# Patient Record
Sex: Male | Born: 1980 | Race: White | Hispanic: Yes | Marital: Married | Smoking: Never smoker
Health system: Southern US, Community
[De-identification: ages and names within clinical notes are randomized; demographics above are authoritative.]

## PROBLEM LIST (undated history)

## (undated) DIAGNOSIS — F1910 Other psychoactive substance abuse, uncomplicated: Secondary | ICD-10-CM

## (undated) DIAGNOSIS — T7840XA Allergy, unspecified, initial encounter: Secondary | ICD-10-CM

## (undated) DIAGNOSIS — F4481 Dissociative identity disorder: Secondary | ICD-10-CM

## (undated) DIAGNOSIS — F4310 Post-traumatic stress disorder, unspecified: Secondary | ICD-10-CM

## (undated) DIAGNOSIS — F1020 Alcohol dependence, uncomplicated: Secondary | ICD-10-CM

## (undated) DIAGNOSIS — E785 Hyperlipidemia, unspecified: Secondary | ICD-10-CM

## (undated) DIAGNOSIS — K219 Gastro-esophageal reflux disease without esophagitis: Secondary | ICD-10-CM

## (undated) HISTORY — DX: Allergy, unspecified, initial encounter: T78.40XA

## (undated) HISTORY — PX: MIDDLE EAR SURGERY: SHX713

## (undated) HISTORY — DX: Post-traumatic stress disorder, unspecified: F43.10

## (undated) HISTORY — DX: Alcohol dependence, uncomplicated: F10.20

## (undated) HISTORY — DX: Hyperlipidemia, unspecified: E78.5

## (undated) HISTORY — DX: Other psychoactive substance abuse, uncomplicated: F19.10

## (undated) HISTORY — DX: Gastro-esophageal reflux disease without esophagitis: K21.9

## (undated) HISTORY — DX: Dissociative identity disorder: F44.81

---
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
## 2009-03-12 ENCOUNTER — Emergency Department (HOSPITAL_COMMUNITY): Admission: EM | Admit: 2009-03-12 | Discharge: 2009-03-13 | Payer: Self-pay | Admitting: Emergency Medicine

## 2009-03-13 ENCOUNTER — Inpatient Hospital Stay (HOSPITAL_COMMUNITY): Admission: AD | Admit: 2009-03-13 | Discharge: 2009-03-18 | Payer: Self-pay | Admitting: Psychiatry

## 2009-03-13 ENCOUNTER — Ambulatory Visit: Payer: Self-pay | Admitting: Psychiatry

## 2009-10-03 ENCOUNTER — Inpatient Hospital Stay (HOSPITAL_COMMUNITY): Admission: EM | Admit: 2009-10-03 | Discharge: 2009-10-08 | Payer: Self-pay | Admitting: Psychiatry

## 2009-10-03 ENCOUNTER — Emergency Department (HOSPITAL_COMMUNITY): Admission: EM | Admit: 2009-10-03 | Discharge: 2009-10-03 | Payer: Self-pay | Admitting: Emergency Medicine

## 2009-10-03 ENCOUNTER — Ambulatory Visit: Payer: Self-pay | Admitting: Psychiatry

## 2010-05-24 LAB — DIFFERENTIAL
Basophils Relative: 1 % (ref 0–1)
Eosinophils Absolute: 0.1 10*3/uL (ref 0.0–0.7)
Monocytes Absolute: 0.9 10*3/uL (ref 0.1–1.0)
Monocytes Relative: 11 % (ref 3–12)
Neutrophils Relative %: 65 % (ref 43–77)

## 2010-05-24 LAB — POCT I-STAT, CHEM 8
Calcium, Ion: 1.08 mmol/L — ABNORMAL LOW (ref 1.12–1.32)
Glucose, Bld: 109 mg/dL — ABNORMAL HIGH (ref 70–99)
HCT: 49 % (ref 39.0–52.0)
Hemoglobin: 16.7 g/dL (ref 13.0–17.0)

## 2010-05-24 LAB — CBC
HCT: 46.9 % (ref 39.0–52.0)
Hemoglobin: 16.3 g/dL (ref 13.0–17.0)
MCH: 30.9 pg (ref 26.0–34.0)
MCHC: 34.7 g/dL (ref 30.0–36.0)
RDW: 13 % (ref 11.5–15.5)

## 2010-05-24 LAB — RAPID URINE DRUG SCREEN, HOSP PERFORMED
Benzodiazepines: POSITIVE — AB
Cocaine: NOT DETECTED
Opiates: NOT DETECTED
Tetrahydrocannabinol: POSITIVE — AB

## 2010-05-25 LAB — CBC
MCHC: 34.5 g/dL (ref 30.0–36.0)
RDW: 12.4 % (ref 11.5–15.5)

## 2010-05-25 LAB — BASIC METABOLIC PANEL
BUN: 9 mg/dL (ref 6–23)
CO2: 26 mEq/L (ref 19–32)
Calcium: 10 mg/dL (ref 8.4–10.5)
Creatinine, Ser: 1.2 mg/dL (ref 0.4–1.5)
Glucose, Bld: 106 mg/dL — ABNORMAL HIGH (ref 70–99)

## 2010-05-25 LAB — DIFFERENTIAL
Basophils Absolute: 0 10*3/uL (ref 0.0–0.1)
Basophils Relative: 0 % (ref 0–1)
Monocytes Absolute: 0.4 10*3/uL (ref 0.1–1.0)
Neutro Abs: 5 10*3/uL (ref 1.7–7.7)
Neutrophils Relative %: 73 % (ref 43–77)

## 2010-05-25 LAB — RAPID URINE DRUG SCREEN, HOSP PERFORMED
Opiates: NOT DETECTED
Tetrahydrocannabinol: POSITIVE — AB

## 2017-03-15 ENCOUNTER — Encounter: Payer: Self-pay | Admitting: Nurse Practitioner

## 2017-03-15 ENCOUNTER — Ambulatory Visit: Payer: 59 | Admitting: Nurse Practitioner

## 2017-03-15 VITALS — BP 122/82 | HR 71 | Temp 98.6°F | Ht 70.0 in | Wt 236.0 lb

## 2017-03-15 DIAGNOSIS — Z8659 Personal history of other mental and behavioral disorders: Secondary | ICD-10-CM | POA: Insufficient documentation

## 2017-03-15 DIAGNOSIS — Z6833 Body mass index (BMI) 33.0-33.9, adult: Secondary | ICD-10-CM | POA: Diagnosis not present

## 2017-03-15 DIAGNOSIS — Z713 Dietary counseling and surveillance: Secondary | ICD-10-CM

## 2017-03-15 DIAGNOSIS — F449 Dissociative and conversion disorder, unspecified: Secondary | ICD-10-CM | POA: Insufficient documentation

## 2017-03-15 DIAGNOSIS — E669 Obesity, unspecified: Secondary | ICD-10-CM

## 2017-03-15 DIAGNOSIS — Z62819 Personal history of unspecified abuse in childhood: Secondary | ICD-10-CM | POA: Insufficient documentation
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
## 2017-03-15 NOTE — Patient Instructions (Addendum)
Please send medical release to get records from [facility].  Please inquire with insurance about date of last CPE. Schedule next CPE with me based on that date.  You can also inquire with insurance about coverage to be seen by [facility]: Dr. Quillian Quincearen Beasley. [phone].  I will like to review previous lab results prior to prescribing any medication. Increase exercise ( 30mins daily)  Thank you for choosing us for your medical needs.   Exercising to Lose Weight Exercising can help you to lose weight. In order to lose weight through exercise, you need to do vigorous-intensity exercise. You can tell that you are exercising with vigorous intensity if you are breathing very hard and fast and cannot hold a conversation while exercising. Moderate-intensity exercise helps to maintain your current weight. You can tell that you are exercising at a moderate level if you have a higher heart rate and faster breathing, but you are still able to hold a conversation. How often should I exercise? Choose an activity that you enjoy and set realistic goals. Your health care provider can help you to make an activity plan that works for you. Exercise regularly as directed by your health care provider. This may include:  Doing resistance training twice each week, such as: ? Push-ups. ? Sit-ups. ? Lifting weights. ? Using resistance bands.  Doing a given intensity of exercise for a given amount of time. Choose from these options: ? 150 minutes of moderate-intensity exercise every week. ? 75 minutes of vigorous-intensity exercise every week. ? A mix of moderate-intensity and vigorous-intensity exercise every week.  Children, pregnant women, people who are out of shape, people who are overweight, and older adults may need to consult a health care provider for individual recommendations. If you have any sort of medical condition, be sure to consult your health care  provider before starting a new exercise program. What are some activities that can help me to lose weight?  Walking at a rate of at least 4.5 miles an hour.  Jogging or running at a rate of 5 miles per hour.  Biking at a rate of at least 10 miles per hour.  Lap swimming.  Roller-skating or in-line skating.  Cross-country skiing.  Vigorous competitive sports, such as football, basketball, and soccer.  Jumping rope.  Aerobic dancing. How can I be more active in my day-to-day activities?  Use the stairs instead of the elevator.  Take a walk during your lunch break.  If you drive, park your car farther away from work or school.  If you take public transportation, get off one stop early and walk the rest of the way.  Make all of your phone calls while standing up and walking around.  Get up, stretch, and walk around every 30 minutes throughout the day. What guidelines should I follow while exercising?  Do not exercise so much that you hurt yourself, feel dizzy, or get very short of breath.  Consult your health care provider prior to starting a new exercise program.  Wear comfortable clothes and shoes with good support.  Drink plenty of water while you exercise to prevent dehydration or heat stroke. Body water is lost during exercise and must be replaced.  Work out until you breathe faster and your heart beats faster. This information is not intended to replace advice given to you by your health care provider. Make sure you discuss any questions you have with your health care provider. Document Released: 03/28/2010  Document Revised: 08/01/2015 Document Reviewed: 07/27/2013 Elsevier Interactive Patient Education  [employer].

## 2017-04-12 ENCOUNTER — Encounter: Payer: Self-pay | Admitting: Nurse Practitioner

## 2017-04-12 ENCOUNTER — Ambulatory Visit: Payer: 59 | Admitting: Nurse Practitioner

## 2017-04-12 VITALS — BP 124/84 | HR 83 | Temp 97.8°F | Ht 70.0 in | Wt 240.0 lb

## 2017-04-12 DIAGNOSIS — E669 Obesity, unspecified: Secondary | ICD-10-CM

## 2017-04-12 DIAGNOSIS — Z713 Dietary counseling and surveillance: Secondary | ICD-10-CM

## 2017-04-12 MED ORDER — LORCASERIN HCL ER 20 MG PO TB24: 1.0000 | ORAL_TABLET | Freq: Every day | ORAL | 0 refills | Status: DC

## 2017-04-12 NOTE — Progress Notes (Signed)
   Subjective:  Patient ID: Shane Munoz, male    DOB: December 16, 1980  Age: 37 y.o. MRN: [id number]  CC: Follow-up (4 wk follow up/weight loss)   HPI Obesity: He will like to discuss weight loss. Weight loss goal 30Lbs (5Lbs per month) Current Exercise: walking 8000steps per day Diet:Decrease portion size and calorie, tracking with app Lost 10Lbs in 2months. Lowest weight 190Ibs in 20s. He will like to use an appetite suppressant despite possible effect on mood and sleep. He has not contacted Dr. Francena HanlyBeasley's office. He is contemplating joining the gym close to home.  No outpatient medications prior to visit.   No facility-administered medications prior to visit.     ROS See HPI  Objective:  BP 124/84   Pulse 83   Temp 97.8 F (36.6 C)   Ht 5\' 10"  (1.778 m)   Wt 240 lb (108.9 kg)   SpO2 95%   BMI 34.44 kg/m   BP Readings from Last 3 Encounters:  04/12/17 124/84  03/15/17 122/82    Wt Readings from Last 3 Encounters:  04/12/17 240 lb (108.9 kg)  03/15/17 236 lb (107 kg)    Physical Exam  Constitutional: He is oriented to person, place, and time. No distress.  Neck: Normal range of motion. Neck supple. No thyromegaly present.  Cardiovascular: Normal rate, regular rhythm and normal heart sounds.  Pulmonary/Chest: Effort normal and breath sounds normal.  Musculoskeletal: He exhibits no edema.  Neurological: He is alert and oriented to person, place, and time.  Skin: Skin is warm and dry.  Vitals reviewed.   Lab Results  Component Value Date   WBC 8.3 10/03/2009   HGB 16.7 10/03/2009   HCT 49.0 10/03/2009   PLT 222 10/03/2009   GLUCOSE 109 (H) 10/03/2009   NA 137 10/03/2009   K 3.6 10/03/2009   CL 105 10/03/2009   CREATININE 1.4 10/03/2009   BUN 21 10/03/2009   CO2 26 03/12/2009    Assessment & Plan:   Gardiner Barefootathaniel was seen today for follow-up.  Diagnoses and all orders for this visit:  Weight loss counseling, encounter for -     Lorcaserin HCl ER 20  MG TB24; Take 1 tablet by mouth daily.  Obesity (BMI 30.0-34.9) -     Lorcaserin HCl ER 20 MG TB24; Take 1 tablet by mouth daily. -     TSH; Future -     Comprehensive metabolic panel; Future -     VITAMIN D 25 Hydroxy (Vit-D Deficiency, Fractures); Future   I am having Shane Gensathaniel Smither start on Lorcaserin HCl ER.  Meds ordered this encounter  Medications  . Lorcaserin HCl ER 20 MG TB24    Sig: Take 1 tablet by mouth daily.    Dispense:  30 tablet    Refill:  0    Order Specific Question:   Supervising Provider    Answer:   Dianne DunARON, TALIA M [[id number]]   Follow-up: Return in about 4 weeks (around 05/10/2017) for weight loss.  Alysia Pennaharlotte Emmersyn Kratzke, NP

## 2017-04-12 NOTE — Patient Instructions (Addendum)
Go to lab for blood draw.  Calorie Counting for Weight Loss Calories are units of energy. Your body needs a certain amount of calories from food to keep you going throughout the day. When you eat more calories than your body needs, your body stores the extra calories as fat. When you eat fewer calories than your body needs, your body burns fat to get the energy it needs. Calorie counting means keeping track of how many calories you eat and drink each day. Calorie counting can be helpful if you need to lose weight. If you make sure to eat fewer calories than your body needs, you should lose weight. Ask your health care provider what a healthy weight is for you. For calorie counting to work, you will need to eat the right number of calories in a day in order to lose a healthy amount of weight per week. A dietitian can help you determine how many calories you need in a day and will give you suggestions on how to reach your calorie goal.  A healthy amount of weight to lose per week is usually 1-2 lb (0.5-0.9 kg). This usually means that your daily calorie intake should be reduced by 500-750 calories.  Eating 1,200 - 1,500 calories per day can help most women lose weight.  Eating 1,500 - 1,800 calories per day can help most men lose weight.  What is my plan? My goal is to have _1800_____ calories per day. If I have this many calories per day, I should lose around _____1_____ pounds per week. What do I need to know about calorie counting? In order to meet your daily calorie goal, you will need to:  Find out how many calories are in each food you would like to eat. Try to do this before you eat.  Decide how much of the food you plan to eat.  Write down what you ate and how many calories it had. Doing this is called keeping a food log.  To successfully lose weight, it is important to balance calorie counting with a healthy lifestyle that includes regular activity. Aim for 150 minutes of moderate  exercise (such as walking) or 75 minutes of vigorous exercise (such as running) each week. Where do I find calorie information?  The number of calories in a food can be found on a Nutrition Facts label. If a food does not have a Nutrition Facts label, try to look up the calories online or ask your dietitian for help. Remember that calories are listed per serving. If you choose to have more than one serving of a food, you will have to multiply the calories per serving by the amount of servings you plan to eat. For example, the label on a package of bread might say that a serving size is 1 slice and that there are 90 calories in a serving. If you eat 1 slice, you will have eaten 90 calories. If you eat 2 slices, you will have eaten 180 calories. How do I keep a food log? Immediately after each meal, record the following information in your food log:  What you ate. Don't forget to include toppings, sauces, and other extras on the food.  How much you ate. This can be measured in cups, ounces, or number of items.  How many calories each food and drink had.  The total number of calories in the meal.  Keep your food log near you, such as in a small notebook in your pocket, or  use a mobile app or website. Some programs will calculate calories for you and show you how many calories you have left for the day to meet your goal. What are some calorie counting tips?  Use your calories on foods and drinks that will fill you up and not leave you hungry: ? Some examples of foods that fill you up are nuts and nut butters, vegetables, lean proteins, and high-fiber foods like whole grains. High-fiber foods are foods with more than 5 g fiber per serving. ? Drinks such as sodas, specialty coffee drinks, alcohol, and juices have a lot of calories, yet do not fill you up.  Eat nutritious foods and avoid empty calories. Empty calories are calories you get from foods or beverages that do not have many vitamins or  protein, such as candy, sweets, and soda. It is better to have a nutritious high-calorie food (such as an avocado) than a food with few nutrients (such as a bag of chips).  Know how many calories are in the foods you eat most often. This will help you calculate calorie counts faster.  Pay attention to calories in drinks. Low-calorie drinks include water and unsweetened drinks.  Pay attention to nutrition labels for "low fat" or "fat free" foods. These foods sometimes have the same amount of calories or more calories than the full fat versions. They also often have added sugar, starch, or salt, to make up for flavor that was removed with the fat.  Find a way of tracking calories that works for you. Get creative. Try different apps or programs if writing down calories does not work for you. What are some portion control tips?  Know how many calories are in a serving. This will help you know how many servings of a certain food you can have.  Use a measuring cup to measure serving sizes. You could also try weighing out portions on a kitchen scale. With time, you will be able to estimate serving sizes for some foods.  Take some time to put servings of different foods on your favorite plates, bowls, and cups so you know what a serving looks like.  Try not to eat straight from a bag or box. Doing this can lead to overeating. Put the amount you would like to eat in a cup or on a plate to make sure you are eating the right portion.  Use smaller plates, glasses, and bowls to prevent overeating.  Try not to multitask (for example, watch TV or use your computer) while eating. If it is time to eat, sit down at a table and enjoy your food. This will help you to know when you are full. It will also help you to be aware of what you are eating and how much you are eating. What are tips for following this plan? Reading food labels  Check the calorie count compared to the serving size. The serving size may be  smaller than what you are used to eating.  Check the source of the calories. Make sure the food you are eating is high in vitamins and protein and low in saturated and trans fats. Shopping  Read nutrition labels while you shop. This will help you make healthy decisions before you decide to purchase your food.  Make a grocery list and stick to it. Cooking  Try to cook your favorite foods in a healthier way. For example, try baking instead of frying.  Use low-fat dairy products. Meal planning  Use more fruits  and vegetables. Half of your plate should be fruits and vegetables.  Include lean proteins like poultry and fish. How do I count calories when eating out?  Ask for smaller portion sizes.  Consider sharing an entree and sides instead of getting your own entree.  If you get your own entree, eat only half. Ask for a box at the beginning of your meal and put the rest of your entree in it so you are not tempted to eat it.  If calories are listed on the menu, choose the lower calorie options.  Choose dishes that include vegetables, fruits, whole grains, low-fat dairy products, and lean protein.  Choose items that are boiled, broiled, grilled, or steamed. Stay away from items that are buttered, battered, fried, or served with cream sauce. Items labeled "crispy" are usually fried, unless stated otherwise.  Choose water, low-fat milk, unsweetened iced tea, or other drinks without added sugar. If you want an alcoholic beverage, choose a lower calorie option such as a glass of wine or light beer.  Ask for dressings, sauces, and syrups on the side. These are usually high in calories, so you should limit the amount you eat.  If you want a salad, choose a garden salad and ask for grilled meats. Avoid extra toppings like bacon, cheese, or fried items. Ask for the dressing on the side, or ask for olive oil and vinegar or lemon to use as dressing.  Estimate how many servings of a food you are  given. For example, a serving of cooked rice is  cup or about the size of half a baseball. Knowing serving sizes will help you be aware of how much food you are eating at restaurants. The list below tells you how big or small some common portion sizes are based on everyday objects: ? 1 oz-4 stacked dice. ? 3 oz-1 deck of cards. ? 1 tsp-1 die. ? 1 Tbsp- a ping-pong ball. ? 2 Tbsp-1 ping-pong ball. ?  cup- baseball. ? 1 cup-1 baseball. Summary  Calorie counting means keeping track of how many calories you eat and drink each day. If you eat fewer calories than your body needs, you should lose weight.  A healthy amount of weight to lose per week is usually 1-2 lb (0.5-0.9 kg). This usually means reducing your daily calorie intake by 500-750 calories.  The number of calories in a food can be found on a Nutrition Facts label. If a food does not have a Nutrition Facts label, try to look up the calories online or ask your dietitian for help.  Use your calories on foods and drinks that will fill you up, and not on foods and drinks that will leave you hungry.  Use smaller plates, glasses, and bowls to prevent overeating. This information is not intended to replace advice given to you by your health care provider. Make sure you discuss any questions you have with your health care provider. Document Released: 02/23/2005 Document Revised: 01/24/2016 Document Reviewed: 01/24/2016 Elsevier Interactive Patient Education  [employer]2018 Elsevier Inc.

## 2017-04-13 ENCOUNTER — Other Ambulatory Visit (INDEPENDENT_AMBULATORY_CARE_PROVIDER_SITE_OTHER): Payer: 59

## 2017-04-13 DIAGNOSIS — E669 Obesity, unspecified: Secondary | ICD-10-CM | POA: Diagnosis not present

## 2017-04-13 DIAGNOSIS — E559 Vitamin D deficiency, unspecified: Secondary | ICD-10-CM

## 2017-04-13 LAB — COMPREHENSIVE METABOLIC PANEL
ALBUMIN: 4.1 g/dL (ref 3.5–5.2)
ALT: 22 U/L (ref 0–53)
AST: 16 U/L (ref 0–37)
Alkaline Phosphatase: 63 U/L (ref 39–117)
BUN: 11 mg/dL (ref 6–23)
CO2: 27 mEq/L (ref 19–32)
CREATININE: 0.97 mg/dL (ref 0.40–1.50)
Calcium: 9 mg/dL (ref 8.4–10.5)
Chloride: 102 mEq/L (ref 96–112)
GFR: 91.81 mL/min (ref >60.00)
GLUCOSE: 103 mg/dL — AB (ref 70–99)
Potassium: 3.3 mEq/L — ABNORMAL LOW (ref 3.5–5.1)
SODIUM: 139 meq/L (ref 135–145)
TOTAL PROTEIN: 6.7 g/dL (ref 6.0–8.3)
Total Bilirubin: 0.6 mg/dL (ref 0.2–1.2)

## 2017-04-13 LAB — TSH: TSH: 2.1 u[IU]/mL (ref 0.35–4.50)

## 2017-04-13 LAB — VITAMIN D 25 HYDROXY (VIT D DEFICIENCY, FRACTURES): VITD: 11.87 ng/mL — ABNORMAL LOW (ref 30.00–100.00)

## 2017-04-14 ENCOUNTER — Telehealth: Payer: Self-pay | Admitting: Nurse Practitioner

## 2017-04-14 MED ORDER — VITAMIN D-3 125 MCG (5000 UT) PO TABS: 1.0000 | ORAL_TABLET | Freq: Every day | ORAL | 0 refills | Status: DC

## 2017-04-14 NOTE — Telephone Encounter (Signed)
Copied from CRM [id number]. Topic: Quick Communication - See Telephone Encounter >> Apr 14, 2017  3:54 PM Rudi CocoLathan, Mackay Hanauer M, NT wrote: CRM for notification. See Telephone encounter for:   04/14/17. Pt. Calling said someone called but no crm or note pt. Does not have vm set up. Pt can be reached at [phone]

## 2017-04-14 NOTE — Telephone Encounter (Signed)
Spoke with the pt. He is aware of his test result.

## 2017-05-10 ENCOUNTER — Ambulatory Visit: Payer: 59 | Admitting: Nurse Practitioner

## 2017-05-10 ENCOUNTER — Encounter: Payer: Self-pay | Admitting: Nurse Practitioner

## 2017-05-10 DIAGNOSIS — Z713 Dietary counseling and surveillance: Secondary | ICD-10-CM | POA: Diagnosis not present

## 2017-05-10 DIAGNOSIS — E669 Obesity, unspecified: Secondary | ICD-10-CM

## 2017-05-10 MED ORDER — LORCASERIN HCL ER 20 MG PO TB24: 1.0000 | ORAL_TABLET | Freq: Every day | ORAL | 1 refills | Status: DC

## 2017-05-10 NOTE — Patient Instructions (Signed)
Exercising to Lose Weight Exercising can help you to lose weight. In order to lose weight through exercise, you need to do vigorous-intensity exercise. You can tell that you are exercising with vigorous intensity if you are breathing very hard and fast and cannot hold a conversation while exercising. Moderate-intensity exercise helps to maintain your current weight. You can tell that you are exercising at a moderate level if you have a higher heart rate and faster breathing, but you are still able to hold a conversation. How often should I exercise? Choose an activity that you enjoy and set realistic goals. Your health care provider can help you to make an activity plan that works for you. Exercise regularly as directed by your health care provider. This may include:  Doing resistance training twice each week, such as: ? Push-ups. ? Sit-ups. ? Lifting weights. ? Using resistance bands.  Doing a given intensity of exercise for a given amount of time. Choose from these options: ? 150 minutes of moderate-intensity exercise every week. ? 75 minutes of vigorous-intensity exercise every week. ? A mix of moderate-intensity and vigorous-intensity exercise every week.  Children, pregnant women, people who are out of shape, people who are overweight, and older adults may need to consult a health care provider for individual recommendations. If you have any sort of medical condition, be sure to consult your health care provider before starting a new exercise program. What are some activities that can help me to lose weight?  Walking at a rate of at least 4.5 miles an hour.  Jogging or running at a rate of 5 miles per hour.  Biking at a rate of at least 10 miles per hour.  Lap swimming.  Roller-skating or in-line skating.  Cross-country skiing.  Vigorous competitive sports, such as football, basketball, and soccer.  Jumping rope.  Aerobic dancing. How can I be more active in my day-to-day  activities?  Use the stairs instead of the elevator.  Take a walk during your lunch break.  If you drive, park your car farther away from work or school.  If you take public transportation, get off one stop early and walk the rest of the way.  Make all of your phone calls while standing up and walking around.  Get up, stretch, and walk around every 30 minutes throughout the day. What guidelines should I follow while exercising?  Do not exercise so much that you hurt yourself, feel dizzy, or get very short of breath.  Consult your health care provider prior to starting a new exercise program.  Wear comfortable clothes and shoes with good support.  Drink plenty of water while you exercise to prevent dehydration or heat stroke. Body water is lost during exercise and must be replaced.  Work out until you breathe faster and your heart beats faster. This information is not intended to replace advice given to you by your health care provider. Make sure you discuss any questions you have with your health care provider. Document Released: 03/28/2010 Document Revised: 08/01/2015 Document Reviewed: 07/27/2013 Elsevier Interactive Patient Education  2018 Elsevier Inc.  

## 2017-05-10 NOTE — Progress Notes (Signed)
Subjective:  Patient ID: Shane Gensathaniel Kraai, male    DOB: [birth year]  Age: 37 y.o. MRN: [id number]  CC: Follow-up (4 wk follow up weight loss. tdap?)  HPI  Weight Loss Counsel: BMI: 34 Lost 3Lbs in last 30days with use of belviq and heart healthy diet. Reports decrease if appetite with belviq Still having difficulty incorporating exercise due to current work schedule. Denies any adverse effects with medication. Denies any change in mood. Wt Readings from Last 3 Encounters:  05/10/17 237 lb (107.5 kg)  04/12/17 240 lb (108.9 kg)  03/15/17 236 lb (107 kg)   Outpatient Medications Prior to Visit  Medication Sig Dispense Refill  . Cholecalciferol (VITAMIN D-3) 5000 units TABS Take 1 tablet by mouth daily after breakfast. 90 tablet 0  . Lorcaserin HCl ER 20 MG TB24 Take 1 tablet by mouth daily. 30 tablet 0   No facility-administered medications prior to visit.     ROS Review of Systems  Constitutional: Negative for malaise/fatigue.  Respiratory: Negative for shortness of breath.   Cardiovascular: Negative for chest pain, palpitations, orthopnea and leg swelling.  Gastrointestinal: Negative for abdominal pain, constipation, diarrhea, nausea and vomiting.  Genitourinary: Negative.   Musculoskeletal: Negative.   Skin: Negative.   Psychiatric/Behavioral: Negative for depression and suicidal ideas. The patient is not nervous/anxious and does not have insomnia.      Objective:  BP 116/78   Pulse 77   Temp 98.3 F (36.8 C)   Ht 5\' 10"  (1.778 m)   Wt 237 lb (107.5 kg)   SpO2 98%   BMI 34.01 kg/m   BP Readings from Last 3 Encounters:  05/10/17 116/78  04/12/17 124/84  03/15/17 122/82    Wt Readings from Last 3 Encounters:  05/10/17 237 lb (107.5 kg)  04/12/17 240 lb (108.9 kg)  03/15/17 236 lb (107 kg)    Physical Exam  Constitutional: He is oriented to person, place, and time. No distress.  Neck: No thyromegaly present.  Cardiovascular: Normal rate, regular rhythm  and normal heart sounds.  Pulmonary/Chest: Effort normal and breath sounds normal.  Musculoskeletal: He exhibits no edema.  Neurological: He is alert and oriented to person, place, and time.  Psychiatric: He has a normal mood and affect. His behavior is normal. Thought content normal.  Vitals reviewed.   Lab Results  Component Value Date   WBC 8.3 10/03/2009   HGB 16.7 10/03/2009   HCT 49.0 10/03/2009   PLT 222 10/03/2009   GLUCOSE 103 (H) 04/13/2017   ALT 22 04/13/2017   AST 16 04/13/2017   NA 139 04/13/2017   K 3.3 (L) 04/13/2017   CL 102 04/13/2017   CREATININE 0.97 04/13/2017   BUN 11 04/13/2017   CO2 27 04/13/2017   TSH 2.10 04/13/2017    Assessment & Plan:   Gardiner Barefootathaniel was seen today for follow-up.  Diagnoses and all orders for this visit:  Obesity (BMI 30.0-34.9) -     Lorcaserin HCl ER 20 MG TB24; Take 1 tablet by mouth daily.  Weight loss counseling, encounter for -     Lorcaserin HCl ER 20 MG TB24; Take 1 tablet by mouth daily.   I am having Shane Munoz maintain his Vitamin D-3 and Lorcaserin HCl ER.  Meds ordered this encounter  Medications  . Lorcaserin HCl ER 20 MG TB24    Sig: Take 1 tablet by mouth daily.    Dispense:  30 tablet    Refill:  1    Order Specific Question:  Supervising Provider    Answer:   Dianne Dun [[id number]]    Follow-up: Return in about 2 months (around 07/10/2017) for weight loss (will need BMP and hepatic function).  Alysia Penna, NP

## 2017-07-12 ENCOUNTER — Ambulatory Visit: Payer: 59 | Admitting: Nurse Practitioner

## 2017-07-12 ENCOUNTER — Encounter: Payer: Self-pay | Admitting: Nurse Practitioner

## 2017-07-12 VITALS — BP 108/70 | HR 70 | Temp 98.6°F | Ht 70.0 in | Wt 243.0 lb

## 2017-07-12 DIAGNOSIS — E669 Obesity, unspecified: Secondary | ICD-10-CM | POA: Diagnosis not present

## 2017-07-12 DIAGNOSIS — E559 Vitamin D deficiency, unspecified: Secondary | ICD-10-CM

## 2017-07-12 DIAGNOSIS — R739 Hyperglycemia, unspecified: Secondary | ICD-10-CM

## 2017-07-12 DIAGNOSIS — Z23 Encounter for immunization: Secondary | ICD-10-CM | POA: Diagnosis not present

## 2017-07-12 DIAGNOSIS — Z114 Encounter for screening for human immunodeficiency virus [HIV]: Secondary | ICD-10-CM | POA: Diagnosis not present

## 2017-07-12 DIAGNOSIS — Z713 Dietary counseling and surveillance: Secondary | ICD-10-CM

## 2017-07-12 LAB — VITAMIN D 25 HYDROXY (VIT D DEFICIENCY, FRACTURES): VITD: 18.12 ng/mL — AB (ref 30.00–100.00)

## 2017-07-12 LAB — BASIC METABOLIC PANEL
BUN: 13 mg/dL (ref 6–23)
CALCIUM: 8.9 mg/dL (ref 8.4–10.5)
CHLORIDE: 104 meq/L (ref 96–112)
CO2: 27 meq/L (ref 19–32)
Creatinine, Ser: 0.99 mg/dL (ref 0.40–1.50)
GFR: 89.55 mL/min (ref >60.00)
Glucose, Bld: 99 mg/dL (ref 70–99)
Potassium: 3.8 mEq/L (ref 3.5–5.1)
SODIUM: 140 meq/L (ref 135–145)

## 2017-07-12 LAB — HEMOGLOBIN A1C: HEMOGLOBIN A1C: 5.5 % (ref 4.6–6.5)

## 2017-07-12 NOTE — Progress Notes (Addendum)
Subjective:  Patient ID: Shane Munoz, male    DOB: 09/17/1980  Age: 37 y.o. MRN: [id number]  CC: Follow-up (2 mo fu/didnt fill Lorcaserine--time issue---more active in 2 wks. tdap)   HPI  Obesity: Has not taken belviq in 63month due to work schedule. Denies nay adverse effects when taking medication. Not consistent with exercise regimen due to work schedule. Reports maintaining 2000Kcal diet at this time. Wt Readings from Last 3 Encounters:  07/12/17 243 lb (110.2 kg)  05/10/17 237 lb (107.5 kg)  04/12/17 240 lb (108.9 kg)   Outpatient Medications Prior to Visit  Medication Sig Dispense Refill  . Cholecalciferol (VITAMIN D-3) 5000 units TABS Take 1 tablet by mouth daily after breakfast. 90 tablet 0  . Lorcaserin HCl ER 20 MG TB24 Take 1 tablet by mouth daily. (Patient not taking: Reported on 07/12/2017) 30 tablet 1   No facility-administered medications prior to visit.     ROS See HPI  Objective:  BP 108/70   Pulse 70   Temp 98.6 F (37 C) (Oral)   Ht  (1.778 m)   Wt 243 lb (110.2 kg)   SpO2 95%   BMI 34.87 kg/m   BP Readings from Last 3 Encounters:  07/12/17 108/70  05/10/17 116/78  04/12/17 124/84    Wt Readings from Last 3 Encounters:  07/12/17 243 lb (110.2 kg)  05/10/17 237 lb (107.5 kg)  04/12/17 240 lb (108.9 kg)   Physical Exam  Constitutional: He is oriented to person, place, and time. No distress.  Neck: Normal range of motion. Neck supple.  Cardiovascular: Normal rate and regular rhythm.  Pulmonary/Chest: Effort normal and breath sounds normal.  Abdominal: Soft. Bowel sounds are normal. He exhibits no distension. There is no tenderness.  Neurological: He is alert and oriented to person, place, and time.  Psychiatric: He has a normal mood and affect. His behavior is normal. Thought content normal.  Vitals reviewed.  Lab Results  Component Value Date   WBC 8.3 10/03/2009   HGB 16.7 10/03/2009   HCT 49.0 10/03/2009   PLT 222 10/03/2009     GLUCOSE 99 07/12/2017   ALT 22 04/13/2017   AST 16 04/13/2017   NA 140 07/12/2017   K 3.8 07/12/2017   CL 104 07/12/2017   CREATININE 0.99 07/12/2017   BUN 13 07/12/2017   CO2 27 07/12/2017   TSH 2.10 04/13/2017   HGBA1C 5.5 07/12/2017    Assessment & Plan:   Shane Munoz was seen today for follow-up.  Diagnoses and all orders for this visit:  Obesity (BMI 30.0-34.9) -     Basic metabolic panel  Need for diphtheria-tetanus-pertussis (Tdap) vaccine -     Tdap vaccine greater than or equal to 7yo IM  Weight loss counseling, encounter for -     Basic metabolic panel  Encounter for screening for human immunodeficiency virus (HIV) -     HIV antibody  Hyperglycemia -     Hemoglobin A1c  Vitamin D deficiency -     VITAMIN D 25 Hydroxy (Vit-D Deficiency, Fractures) -     Vitamin D, Ergocalciferol, (DRISDOL) 50000 units CAPS capsule; Take 1 capsule (50,000 Units total) by mouth every 7 (seven) days.   I have discontinued Shane Munoz's Vitamin D-3. I am also having him start on Vitamin D (Ergocalciferol). Additionally, I am having him maintain his Lorcaserin HCl ER.  Meds ordered this encounter  Medications  . Vitamin D, Ergocalciferol, (DRISDOL) 50000 units CAPS capsule  Sig: Take 1 capsule (50,000 Units total) by mouth every 7 (seven) days.    Dispense:  6 capsule    Refill:  0    Order Specific Question:   Supervising Provider    Answer:   Dianne Dun [[id number]]    Follow-up: Return in about 2 months (around 09/11/2017) for weight loss.Alysia Penna, NP

## 2017-07-12 NOTE — Patient Instructions (Addendum)
Consider use of Wellbutrin and metformin if no weight loss with belviq. Also consider weight loss clinic referreal  Calorie Counting for Weight Loss Calories are units of energy. Your body needs a certain amount of calories from food to keep you going throughout the day. When you eat more calories than your body needs, your body stores the extra calories as fat. When you eat fewer calories than your body needs, your body burns fat to get the energy it needs. Calorie counting means keeping track of how many calories you eat and drink each day. Calorie counting can be helpful if you need to lose weight. If you make sure to eat fewer calories than your body needs, you should lose weight. Ask your health care provider what a healthy weight is for you. For calorie counting to work, you will need to eat the right number of calories in a day in order to lose a healthy amount of weight per week. A dietitian can help you determine how many calories you need in a day and will give you suggestions on how to reach your calorie goal.  A healthy amount of weight to lose per week is usually 1-2 lb (0.5-0.9 kg). This usually means that your daily calorie intake should be reduced by 500-750 calories.  Eating 1,200 - 1,500 calories per day can help most women lose weight.  Eating 1,500 - 1,800 calories per day can help most men lose weight.  What is my plan? My goal is to have __1800________ calories per day. If I have this many calories per day, I should lose around _____1____ pounds per week. What do I need to know about calorie counting? In order to meet your daily calorie goal, you will need to:  Find out how many calories are in each food you would like to eat. Try to do this before you eat.  Decide how much of the food you plan to eat.  Write down what you ate and how many calories it had. Doing this is called keeping a food log.  To successfully lose weight, it is important to balance calorie counting  with a healthy lifestyle that includes regular activity. Aim for 150 minutes of moderate exercise (such as walking) or 75 minutes of vigorous exercise (such as running) each week. Where do I find calorie information?  The number of calories in a food can be found on a Nutrition Facts label. If a food does not have a Nutrition Facts label, try to look up the calories online or ask your dietitian for help. Remember that calories are listed per serving. If you choose to have more than one serving of a food, you will have to multiply the calories per serving by the amount of servings you plan to eat. For example, the label on a package of bread might say that a serving size is 1 slice and that there are 90 calories in a serving. If you eat 1 slice, you will have eaten 90 calories. If you eat 2 slices, you will have eaten 180 calories. How do I keep a food log? Immediately after each meal, record the following information in your food log:  What you ate. Don't forget to include toppings, sauces, and other extras on the food.  How much you ate. This can be measured in cups, ounces, or number of items.  How many calories each food and drink had.  The total number of calories in the meal.  Keep your food log  near you, such as in a small notebook in your pocket, or use a mobile app or website. Some programs will calculate calories for you and show you how many calories you have left for the day to meet your goal. What are some calorie counting tips?  Use your calories on foods and drinks that will fill you up and not leave you hungry: ? Some examples of foods that fill you up are nuts and nut butters, vegetables, lean proteins, and high-fiber foods like whole grains. High-fiber foods are foods with more than 5 g fiber per serving. ? Drinks such as sodas, specialty coffee drinks, alcohol, and juices have a lot of calories, yet do not fill you up.  Eat nutritious foods and avoid empty calories. Empty  calories are calories you get from foods or beverages that do not have many vitamins or protein, such as candy, sweets, and soda. It is better to have a nutritious high-calorie food (such as an avocado) than a food with few nutrients (such as a bag of chips).  Know how many calories are in the foods you eat most often. This will help you calculate calorie counts faster.  Pay attention to calories in drinks. Low-calorie drinks include water and unsweetened drinks.  Pay attention to nutrition labels for "low fat" or "fat free" foods. These foods sometimes have the same amount of calories or more calories than the full fat versions. They also often have added sugar, starch, or salt, to make up for flavor that was removed with the fat.  Find a way of tracking calories that works for you. Get creative. Try different apps or programs if writing down calories does not work for you. What are some portion control tips?  Know how many calories are in a serving. This will help you know how many servings of a certain food you can have.  Use a measuring cup to measure serving sizes. You could also try weighing out portions on a kitchen scale. With time, you will be able to estimate serving sizes for some foods.  Take some time to put servings of different foods on your favorite plates, bowls, and cups so you know what a serving looks like.  Try not to eat straight from a bag or box. Doing this can lead to overeating. Put the amount you would like to eat in a cup or on a plate to make sure you are eating the right portion.  Use smaller plates, glasses, and bowls to prevent overeating.  Try not to multitask (for example, watch TV or use your computer) while eating. If it is time to eat, sit down at a table and enjoy your food. This will help you to know when you are full. It will also help you to be aware of what you are eating and how much you are eating. What are tips for following this plan? Reading food  labels  Check the calorie count compared to the serving size. The serving size may be smaller than what you are used to eating.  Check the source of the calories. Make sure the food you are eating is high in vitamins and protein and low in saturated and trans fats. Shopping  Read nutrition labels while you shop. This will help you make healthy decisions before you decide to purchase your food.  Make a grocery list and stick to it. Cooking  Try to cook your favorite foods in a healthier way. For example, try baking instead of  frying.  Use low-fat dairy products. Meal planning  Use more fruits and vegetables. Half of your plate should be fruits and vegetables.  Include lean proteins like poultry and fish. How do I count calories when eating out?  Ask for smaller portion sizes.  Consider sharing an entree and sides instead of getting your own entree.  If you get your own entree, eat only half. Ask for a box at the beginning of your meal and put the rest of your entree in it so you are not tempted to eat it.  If calories are listed on the menu, choose the lower calorie options.  Choose dishes that include vegetables, fruits, whole grains, low-fat dairy products, and lean protein.  Choose items that are boiled, broiled, grilled, or steamed. Stay away from items that are buttered, battered, fried, or served with cream sauce. Items labeled "crispy" are usually fried, unless stated otherwise.  Choose water, low-fat milk, unsweetened iced tea, or other drinks without added sugar. If you want an alcoholic beverage, choose a lower calorie option such as a glass of wine or light beer.  Ask for dressings, sauces, and syrups on the side. These are usually high in calories, so you should limit the amount you eat.  If you want a salad, choose a garden salad and ask for grilled meats. Avoid extra toppings like bacon, cheese, or fried items. Ask for the dressing on the side, or ask for olive oil  and vinegar or lemon to use as dressing.  Estimate how many servings of a food you are given. For example, a serving of cooked rice is  cup or about the size of half a baseball. Knowing serving sizes will help you be aware of how much food you are eating at restaurants. The list below tells you how big or small some common portion sizes are based on everyday objects: ? 1 oz-4 stacked dice. ? 3 oz-1 deck of cards. ? 1 tsp-1 die. ? 1 Tbsp- a ping-pong ball. ? 2 Tbsp-1 ping-pong ball. ?  cup- baseball. ? 1 cup-1 baseball. Summary  Calorie counting means keeping track of how many calories you eat and drink each day. If you eat fewer calories than your body needs, you should lose weight.  A healthy amount of weight to lose per week is usually 1-2 lb (0.5-0.9 kg). This usually means reducing your daily calorie intake by 500-750 calories.  The number of calories in a food can be found on a Nutrition Facts label. If a food does not have a Nutrition Facts label, try to look up the calories online or ask your dietitian for help.  Use your calories on foods and drinks that will fill you up, and not on foods and drinks that will leave you hungry.  Use smaller plates, glasses, and bowls to prevent overeating. This information is not intended to replace advice given to you by your health care provider. Make sure you discuss any questions you have with your health care provider. Document Released: 02/23/2005 Document Revised: 01/24/2016 Document Reviewed: 01/24/2016 Elsevier Interactive Patient Education  [employer].

## 2017-07-13 DIAGNOSIS — Z713 Dietary counseling and surveillance: Secondary | ICD-10-CM | POA: Insufficient documentation

## 2017-07-13 DIAGNOSIS — R739 Hyperglycemia, unspecified: Secondary | ICD-10-CM | POA: Insufficient documentation

## 2017-07-13 DIAGNOSIS — E559 Vitamin D deficiency, unspecified: Secondary | ICD-10-CM | POA: Insufficient documentation

## 2017-07-13 DIAGNOSIS — E669 Obesity, unspecified: Secondary | ICD-10-CM | POA: Insufficient documentation

## 2017-07-13 LAB — HIV ANTIBODY (ROUTINE TESTING W REFLEX): HIV 1&2 Ab, 4th Generation: NONREACTIVE

## 2017-07-13 MED ORDER — VITAMIN D (ERGOCALCIFEROL) 1.25 MG (50000 UNIT) PO CAPS: 50000.0000 [IU] | ORAL_CAPSULE | ORAL | 0 refills | Status: DC

## 2017-07-13 NOTE — Addendum Note (Signed)
Addended by: Alysia Penna L on: 07/13/2017 10:03 AM   Modules accepted: Orders, Level of Service

## 2017-09-13 ENCOUNTER — Ambulatory Visit: Payer: 59 | Admitting: Nurse Practitioner

## 2018-08-29 ENCOUNTER — Telehealth: Payer: Self-pay | Admitting: Nurse Practitioner
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
## 2018-08-30 ENCOUNTER — Ambulatory Visit (INDEPENDENT_AMBULATORY_CARE_PROVIDER_SITE_OTHER): Payer: Managed Care, Other (non HMO) | Admitting: Nurse Practitioner

## 2018-08-30 ENCOUNTER — Encounter: Payer: Self-pay | Admitting: Nurse Practitioner

## 2018-08-30 VITALS — BP 128/84 | HR 62 | Temp 98.5°F | Ht 70.0 in | Wt 241.6 lb

## 2018-08-30 DIAGNOSIS — E782 Mixed hyperlipidemia: Secondary | ICD-10-CM

## 2018-08-30 DIAGNOSIS — Z0001 Encounter for general adult medical examination with abnormal findings: Secondary | ICD-10-CM | POA: Diagnosis not present

## 2018-08-30 DIAGNOSIS — E559 Vitamin D deficiency, unspecified: Secondary | ICD-10-CM

## 2018-08-30 DIAGNOSIS — E669 Obesity, unspecified: Secondary | ICD-10-CM

## 2018-08-30 NOTE — Patient Instructions (Addendum)
Normal renal and liver function.  Abnormal lipid panel: this could be due to non-fasting state, but I dod not think that is the only reason.  I strongly recommend low fat and low carb diet, and walking (moderate intensity( 76mins) a day. Return to lab in 1month (fasting for repeat lipid panel  Need to maintain waist/hip ratio at <0.9. You are at 0.98 today  Health Maintenance, Male A healthy lifestyle and preventive care is important for your health and wellness. Ask your health care provider about what schedule of regular examinations is right for you. What should I know about weight and diet? Eat a Healthy Diet  Eat plenty of vegetables, fruits, whole grains, low-fat dairy products, and lean protein.  Do not eat a lot of foods high in solid fats, added sugars, or salt.  Maintain a Healthy Weight Regular exercise can help you achieve or maintain a healthy weight. You should:  Do at least 150 minutes of exercise each week. The exercise should increase your heart rate and make you sweat (moderate-intensity exercise).  Do strength-training exercises at least twice a week. Watch Your Levels of Cholesterol and Blood Lipids  Have your blood tested for lipids and cholesterol every 5 years starting at 40 years of age. If you are at high risk for heart disease, you should start having your blood tested when you are 40 years old. You may need to have your cholesterol levels checked more often if: ? Your lipid or cholesterol levels are high. ? You are older than 40 years of age. ? You are at high risk for heart disease. What should I know about cancer screening? Many types of cancers can be detected early and may often be prevented. Lung Cancer  You should be screened every year for lung cancer if: ? You are a current smoker who has smoked for at least 30 years. ? You are a former smoker who has quit within the past 15 years.  Talk to your health care provider about your screening options,  when you should start screening, and how often you should be screened. Colorectal Cancer  Routine colorectal cancer screening usually begins at 40 years of age and should be repeated every 5-10 years until you are 40 years old. You may need to be screened more often if early forms of precancerous polyps or small growths are found. Your health care provider may recommend screening at an earlier age if you have risk factors for colon cancer.  Your health care provider may recommend using home test kits to check for hidden blood in the stool.  A small camera at the end of a tube can be used to examine your colon (sigmoidoscopy or colonoscopy). This checks for the earliest forms of colorectal cancer. Prostate and Testicular Cancer  Depending on your age and overall health, your health care provider may do certain tests to screen for prostate and testicular cancer.  Talk to your health care provider about any symptoms or concerns you have about testicular or prostate cancer. Skin Cancer  Check your skin from head to toe regularly.  Tell your health care provider about any new moles or changes in moles, especially if: ? There is a change in a mole's size, shape, or color. ? You have a mole that is larger than a pencil eraser.  Always use sunscreen. Apply sunscreen liberally and repeat throughout the day.  Protect yourself by wearing long sleeves, pants, a wide-brimmed hat, and sunglasses when outside. What should  I know about heart disease, diabetes, and high blood pressure?  If you are 4418-40 years of age, have your blood pressure checked every 3-5 years. If you are 40 years of age or older, have your blood pressure checked every year. You should have your blood pressure measured twice-once when you are at a hospital or clinic, and once when you are not at a hospital or clinic. Record the average of the two measurements. To check your blood pressure when you are not at a hospital or clinic, you  can use: ? An automated blood pressure machine at a pharmacy. ? A home blood pressure monitor.  Talk to your health care provider about your target blood pressure.  If you are between 2645-40 years old, ask your health care provider if you should take aspirin to prevent heart disease.  Have regular diabetes screenings by checking your fasting blood sugar level. ? If you are at a normal weight and have a low risk for diabetes, have this test once every three years after the age of 40. ? If you are overweight and have a high risk for diabetes, consider being tested at a younger age or more often.  A one-time screening for abdominal aortic aneurysm (AAA) by ultrasound is recommended for men aged 65-75 years who are current or former smokers. What should I know about preventing infection? Hepatitis B If you have a higher risk for hepatitis B, you should be screened for this virus. Talk with your health care provider to find out if you are at risk for hepatitis B infection. Hepatitis C Blood testing is recommended for:  Everyone born from [id number] through 1967.  Anyone with known risk factors for hepatitis C. Sexually Transmitted Diseases (STDs)  You should be screened each year for STDs including gonorrhea and chlamydia if: ? You are sexually active and are younger than 40 years of age. ? You are older than 40 years of age and your health care provider tells you that you are at risk for this type of infection. ? Your sexual activity has changed since you were last screened and you are at an increased risk for chlamydia or gonorrhea. Ask your health care provider if you are at risk.  Talk with your health care provider about whether you are at high risk of being infected with HIV. Your health care provider may recommend a prescription medicine to help prevent HIV infection. What else can I do?  Schedule regular health, dental, and eye exams.  Stay current with your vaccines (immunizations).   Do not use any tobacco products, such as cigarettes, chewing tobacco, and e-cigarettes. If you need help quitting, ask your health care provider.  Limit alcohol intake to no more than 2 drinks per day. One drink equals 12 ounces of beer, 5 ounces of wine, or 1 ounces of hard liquor.  Do not use street drugs.  Do not share needles.  Ask your health care provider for help if you need support or information about quitting drugs.  Tell your health care provider if you often feel depressed.  Tell your health care provider if you have ever been abused or do not feel safe at home. This information is not intended to replace advice given to you by your health care provider. Make sure you discuss any questions you have with your health care provider. Document Released: 08/22/2007 Document Revised: 10/23/2015 Document Reviewed: 11/27/2014 Elsevier Interactive Patient Education  2019 [employer]
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
## 2018-09-01 LAB — COMPREHENSIVE METABOLIC PANEL
AG Ratio: 1.9 (calc) (ref 1.0–2.5)
ALT: 27 U/L (ref 9–46)
AST: 19 U/L (ref 10–40)
Albumin: 4.7 g/dL (ref 3.6–5.1)
Alkaline phosphatase (APISO): 81 U/L (ref 36–130)
BUN: 16 mg/dL (ref 7–25)
CO2: 25 mmol/L (ref 20–32)
Calcium: 9.8 mg/dL (ref 8.6–10.3)
Chloride: 103 mmol/L (ref 98–110)
Creat: 0.93 mg/dL (ref 0.60–1.35)
Globulin: 2.5 g/dL (calc) (ref 1.9–3.7)
Glucose, Bld: 97 mg/dL (ref 65–99)
Potassium: 4.3 mmol/L (ref 3.5–5.3)
Sodium: 138 mmol/L (ref 135–146)
Total Bilirubin: 0.6 mg/dL (ref 0.2–1.2)
Total Protein: 7.2 g/dL (ref 6.1–8.1)

## 2018-09-01 LAB — LIPID PANEL
Cholesterol: 239 mg/dL — ABNORMAL HIGH (ref <200)
HDL: 43 mg/dL (ref >=40)
LDL Cholesterol (Calc): 146 mg/dL (calc) — ABNORMAL HIGH
Non-HDL Cholesterol (Calc): 196 mg/dL (calc) — ABNORMAL HIGH (ref <130)
Total CHOL/HDL Ratio: 5.6 (calc) — ABNORMAL HIGH (ref <5.0)
Triglycerides: 325 mg/dL — ABNORMAL HIGH (ref <150)

## 2018-09-01 LAB — VITAMIN D 1,25 DIHYDROXY
Vitamin D 1, 25 (OH)2 Total: 71 pg/mL (ref 18–72)
Vitamin D2 1, 25 (OH)2: <8 pg/mL
Vitamin D3 1, 25 (OH)2: 71 pg/mL

## 2018-09-30 ENCOUNTER — Other Ambulatory Visit (INDEPENDENT_AMBULATORY_CARE_PROVIDER_SITE_OTHER): Payer: Managed Care, Other (non HMO)

## 2018-09-30 DIAGNOSIS — E782 Mixed hyperlipidemia: Secondary | ICD-10-CM | POA: Diagnosis not present

## 2018-10-01 LAB — LIPID PANEL
Cholesterol: 188 mg/dL (ref <200)
HDL: 37 mg/dL — ABNORMAL LOW (ref >=40)
LDL Cholesterol (Calc): 119 mg/dL (calc) — ABNORMAL HIGH
Non-HDL Cholesterol (Calc): 151 mg/dL (calc) — ABNORMAL HIGH (ref <130)
Total CHOL/HDL Ratio: 5.1 (calc) — ABNORMAL HIGH (ref <5.0)
Triglycerides: 201 mg/dL — ABNORMAL HIGH (ref <150)

## 2018-10-03 MED ORDER — FENOFIBRATE 145 MG PO TABS: 145.0000 mg | ORAL_TABLET | Freq: Every day | ORAL | 1 refills | Status: DC

## 2018-10-03 NOTE — Addendum Note (Signed)
Addended by: Wilfred Lacy L on: 10/03/2018 11:19 AM   Modules accepted: Orders

## 2018-11-21 ENCOUNTER — Telehealth: Payer: Self-pay | Admitting: Nurse Practitioner

## 2018-11-21 NOTE — Telephone Encounter (Signed)

## 2018-11-22 ENCOUNTER — Ambulatory Visit (INDEPENDENT_AMBULATORY_CARE_PROVIDER_SITE_OTHER): Payer: 59 | Admitting: Behavioral Health

## 2018-11-22 DIAGNOSIS — Z23 Encounter for immunization: Secondary | ICD-10-CM

## 2018-11-22 NOTE — Progress Notes (Signed)
Patient presents in clinic today for Influenza vaccination. IM injection was given in the left deltoid. Patient tolerated the injection well. No signs or symptoms of a reaction were noted prior to patient leaving the nurse visit. 

## 2019-01-23 ENCOUNTER — Telehealth: Payer: Self-pay | Admitting: Nurse Practitioner

## 2019-01-23 NOTE — Telephone Encounter (Signed)
LVM for the pt to call back, ok for triage nurse to give detail message.  

## 2019-01-23 NOTE — Telephone Encounter (Signed)
Unfortunate I am not able to help with that. I can entered referral to urology to discuss this, I need to know the reason for this request.

## 2019-01-23 NOTE — Telephone Encounter (Signed)
Charlotte please advise.    Copied from [place] [id number]. Topic: General - Inquiry >> Jan 20, 2019  4:21 PM Berneta Levins wrote: Reason for CRM:   Pt's wife calling in - wants to know if the office does a semen analysis.  States that they have had this done at the OB/GYN office but were not happy with how the office handled things and want to have a rest done. Ria Comment can be reached at [phone]

## 2019-01-24 NOTE — Telephone Encounter (Signed)
LVM for the pt to call back.

## 2019-04-27 ENCOUNTER — Encounter: Payer: Self-pay | Admitting: Nurse Practitioner

## 2019-04-27 DIAGNOSIS — N4601 Organic azoospermia: Secondary | ICD-10-CM

## 2019-05-05 ENCOUNTER — Encounter (INDEPENDENT_AMBULATORY_CARE_PROVIDER_SITE_OTHER): Payer: Self-pay

## 2019-05-14 ENCOUNTER — Encounter: Payer: Self-pay | Admitting: Nurse Practitioner

## 2019-08-20 ENCOUNTER — Other Ambulatory Visit: Payer: Self-pay

## 2019-08-20 ENCOUNTER — Emergency Department (HOSPITAL_COMMUNITY): Payer: 59

## 2019-08-20 ENCOUNTER — Encounter (HOSPITAL_COMMUNITY): Payer: Self-pay

## 2019-08-20 ENCOUNTER — Emergency Department (HOSPITAL_COMMUNITY)
Admission: EM | Admit: 2019-08-20 | Discharge: 2019-08-21 | Disposition: A | Discharge status: Home / Self Care | Payer: 59 | Attending: Emergency Medicine | Admitting: Emergency Medicine

## 2019-08-20 DIAGNOSIS — Z8639 Personal history of other endocrine, nutritional and metabolic disease: Secondary | ICD-10-CM | POA: Diagnosis not present

## 2019-08-20 DIAGNOSIS — R0789 Other chest pain: Secondary | ICD-10-CM | POA: Diagnosis not present

## 2019-08-20 DIAGNOSIS — F449 Dissociative and conversion disorder, unspecified: Secondary | ICD-10-CM | POA: Diagnosis present

## 2019-08-20 DIAGNOSIS — E669 Obesity, unspecified: Secondary | ICD-10-CM | POA: Diagnosis not present

## 2019-08-20 DIAGNOSIS — Z20822 Contact with and (suspected) exposure to covid-19: Secondary | ICD-10-CM | POA: Insufficient documentation

## 2019-08-20 DIAGNOSIS — E66811 Obesity, class 1: Secondary | ICD-10-CM | POA: Diagnosis present

## 2019-08-20 DIAGNOSIS — F4481 Dissociative identity disorder: Secondary | ICD-10-CM | POA: Diagnosis not present

## 2019-08-20 DIAGNOSIS — R000 Tachycardia, unspecified: Secondary | ICD-10-CM

## 2019-08-20 DIAGNOSIS — R0602 Shortness of breath: Secondary | ICD-10-CM | POA: Diagnosis present

## 2019-08-20 DIAGNOSIS — Z8659 Personal history of other mental and behavioral disorders: Secondary | ICD-10-CM

## 2019-08-20 DIAGNOSIS — R079 Chest pain, unspecified: Secondary | ICD-10-CM

## 2019-08-20 NOTE — ED Notes (Signed)
Pt able to ambulate to restroom without assistance.  

## 2019-08-20 NOTE — ED Triage Notes (Signed)
Patient arrived stating he has had congestion over the last two days, reports taking Musinex today and had sudden onset of trembling and shortness of breath today. States he has an appointment for anxiety and had a lot of caffeine today.

## 2019-08-21 ENCOUNTER — Encounter (HOSPITAL_COMMUNITY): Payer: Self-pay | Admitting: Cardiology

## 2019-08-21 ENCOUNTER — Emergency Department (HOSPITAL_COMMUNITY): Payer: 59

## 2019-08-21 ENCOUNTER — Emergency Department (HOSPITAL_BASED_OUTPATIENT_CLINIC_OR_DEPARTMENT_OTHER): Payer: 59

## 2019-08-21 DIAGNOSIS — Z8659 Personal history of other mental and behavioral disorders: Secondary | ICD-10-CM

## 2019-08-21 DIAGNOSIS — R079 Chest pain, unspecified: Secondary | ICD-10-CM | POA: Diagnosis not present

## 2019-08-21 DIAGNOSIS — F449 Dissociative and conversion disorder, unspecified: Secondary | ICD-10-CM

## 2019-08-21 DIAGNOSIS — E669 Obesity, unspecified: Secondary | ICD-10-CM

## 2019-08-21 LAB — TSH: TSH: 1.138 u[IU]/mL (ref 0.350–4.500)

## 2019-08-21 LAB — COMPREHENSIVE METABOLIC PANEL
ALT: 24 U/L (ref 0–44)
AST: 21 U/L (ref 15–41)
Albumin: 3.9 g/dL (ref 3.5–5.0)
Alkaline Phosphatase: 63 U/L (ref 38–126)
Anion gap: 14 (ref 5–15)
BUN: 15 mg/dL (ref 6–20)
CO2: 22 mmol/L (ref 22–32)
Calcium: 8.7 mg/dL — ABNORMAL LOW (ref 8.9–10.3)
Chloride: 103 mmol/L (ref 98–111)
Creatinine, Ser: 1.01 mg/dL (ref 0.61–1.24)
GFR calc Af Amer: >60 mL/min (ref >60)
GFR calc non Af Amer: >60 mL/min (ref >60)
Glucose, Bld: 116 mg/dL — ABNORMAL HIGH (ref 70–99)
Potassium: 4.4 mmol/L (ref 3.5–5.1)
Sodium: 139 mmol/L (ref 135–145)
Total Bilirubin: 0.9 mg/dL (ref 0.3–1.2)
Total Protein: 6.4 g/dL — ABNORMAL LOW (ref 6.5–8.1)

## 2019-08-21 LAB — MAGNESIUM: Magnesium: 2.2 mg/dL (ref 1.7–2.4)

## 2019-08-21 LAB — CBC WITH DIFFERENTIAL/PLATELET
Abs Immature Granulocytes: 0.03 10*3/uL (ref 0.00–0.07)
Basophils Absolute: 0.1 10*3/uL (ref 0.0–0.1)
Basophils Relative: 1 %
Eosinophils Absolute: 0 10*3/uL (ref 0.0–0.5)
Eosinophils Relative: 0 %
HCT: 48.7 % (ref 39.0–52.0)
Hemoglobin: 16.7 g/dL (ref 13.0–17.0)
Immature Granulocytes: 0 %
Lymphocytes Relative: 22 %
Lymphs Abs: 2.3 10*3/uL (ref 0.7–4.0)
MCH: 29 pg (ref 26.0–34.0)
MCHC: 34.3 g/dL (ref 30.0–36.0)
MCV: 84.7 fL (ref 80.0–100.0)
Monocytes Absolute: 0.7 10*3/uL (ref 0.1–1.0)
Monocytes Relative: 6 %
Neutro Abs: 7.6 10*3/uL (ref 1.7–7.7)
Neutrophils Relative %: 71 %
Platelets: 258 10*3/uL (ref 150–400)
RBC: 5.75 MIL/uL (ref 4.22–5.81)
RDW: 12 % (ref 11.5–15.5)
WBC: 10.6 10*3/uL — ABNORMAL HIGH (ref 4.0–10.5)
nRBC: 0 % (ref 0.0–0.2)

## 2019-08-21 LAB — URINALYSIS, ROUTINE W REFLEX MICROSCOPIC
Bilirubin Urine: NEGATIVE
Glucose, UA: NEGATIVE mg/dL
Hgb urine dipstick: NEGATIVE
Ketones, ur: 20 mg/dL — AB
Leukocytes,Ua: NEGATIVE
Nitrite: NEGATIVE
Protein, ur: NEGATIVE mg/dL
Specific Gravity, Urine: 1.019 (ref 1.005–1.030)
pH: 6 (ref 5.0–8.0)

## 2019-08-21 LAB — ECHOCARDIOGRAM COMPLETE
Height: 70 in
Weight: 3840 oz

## 2019-08-21 LAB — LIPID PANEL
Cholesterol: 187 mg/dL (ref 0–200)
HDL: 34 mg/dL — ABNORMAL LOW (ref >40)
LDL Cholesterol: 123 mg/dL — ABNORMAL HIGH (ref 0–99)
Total CHOL/HDL Ratio: 5.5 RATIO
Triglycerides: 150 mg/dL — ABNORMAL HIGH (ref <150)
VLDL: 30 mg/dL (ref 0–40)

## 2019-08-21 LAB — T4, FREE: Free T4: 1.11 ng/dL (ref 0.61–1.12)

## 2019-08-21 LAB — CK: Total CK: 43 U/L — ABNORMAL LOW (ref 49–397)

## 2019-08-21 LAB — SARS CORONAVIRUS 2 BY RT PCR (HOSPITAL ORDER, PERFORMED IN ~~LOC~~ HOSPITAL LAB): SARS Coronavirus 2: NEGATIVE

## 2019-08-21 LAB — TROPONIN I (HIGH SENSITIVITY)
Troponin I (High Sensitivity): 50 ng/L — ABNORMAL HIGH (ref <18)
Troponin I (High Sensitivity): 44 ng/L — ABNORMAL HIGH (ref <18)
Troponin I (High Sensitivity): 19 ng/L — ABNORMAL HIGH (ref <18)

## 2019-08-21 LAB — LIPASE, BLOOD: Lipase: 26 U/L (ref 11–51)

## 2019-08-21 LAB — D-DIMER, QUANTITATIVE: D-Dimer, Quant: 0.3 ug/mL-FEU (ref 0.00–0.50)

## 2019-08-21 MED ORDER — METOPROLOL TARTRATE 25 MG PO TABS: 25.0000 mg | ORAL_TABLET | Freq: Two times a day (BID) | ORAL | 0 refills | Status: DC

## 2019-08-21 MED ORDER — METOPROLOL TARTRATE 25 MG PO TABS
25.0000 mg | ORAL_TABLET | Freq: Two times a day (BID) | ORAL | Status: DC
  Administered 2019-08-21: 25 mg via ORAL
  Filled 2019-08-21: qty 1

## 2019-08-21 MED ORDER — PERFLUTREN LIPID MICROSPHERE
1.0000 mL | INTRAVENOUS | Status: AC | PRN
  Administered 2019-08-21: 5 mL via INTRAVENOUS
  Filled 2019-08-21: qty 10

## 2019-08-21 MED ORDER — NITROGLYCERIN 0.4 MG SL SUBL
0.8000 mg | SUBLINGUAL_TABLET | Freq: Once | SUBLINGUAL | Status: AC
  Administered 2019-08-21: 0.8 mg via SUBLINGUAL

## 2019-08-21 MED ORDER — ASPIRIN 81 MG PO CHEW
324.0000 mg | CHEWABLE_TABLET | Freq: Once | ORAL | Status: AC
  Administered 2019-08-21: 324 mg via ORAL
  Filled 2019-08-21: qty 4

## 2019-08-21 MED ORDER — IOHEXOL 350 MG/ML SOLN
80.0000 mL | Freq: Once | INTRAVENOUS | Status: AC | PRN
  Administered 2019-08-21: 80 mL via INTRAVENOUS

## 2019-08-21 NOTE — Discharge Instructions (Signed)
Good News the Echocardiogram (ultrasound of your heart) and the cardiac CT was normal as well no coronary artery blockage.  The rapid heart rate may have increased the troponins.     Decrease caffeine, no pseudoephedrine both may increase your Heart rate.      Follow up with Cardiology

## 2019-08-21 NOTE — Discharge Summary (Signed)
Physician Discharge Summary  Shane Gensathaniel Thaden ZOX:[id number] DOB: 03-04-1981 DOA: 08/20/2019  PCP: Anne NgNche, Charlotte Lum, NP  Admit date: 08/20/2019 Discharge date: 08/21/2019  Time spent:7630minutes  Recommendations for Outpatient Follow-up:  Chest pain unspecified type; -Per cardiology work-up patient is okay for discharge -Follow-up with Dr.Achayra Gayatri 8/20@1000   PTSD/Dissociative disorder -Patient will be started on medication but I counseled patient that he needed to speak with his PCP before starting medication in this way he can be monitored for efficacy.  Patient agreed to this approach as he has an appointment with his PCP on Thursday.  Obesity   Discharge Diagnoses:  Active Problems:   History of posttraumatic stress disorder (PTSD)   Dissociative disorder   Obesity (BMI 30.0-34.9)   Discharge Condition: Stable  Diet recommendation: Regular  Filed Weights   08/20/19 1950  Weight: 108.9 kg    History of present illness:  Shane Munoz is a 39 y.o. adult with a history of hyperlipidemia, PTSD, dissociative identity disorder, EtOH abuse,  Presented to the emergency department with a chief complaint of shortness of breath.  The patient reports that he had an episode of shortness of breath accompanied by "pricking" central chest pain and upper abdominal pain, paresthesias in his bilateral arms and legs, tingling in his face.  He reports that his fingers in his bilateral hands turned blue, and his hands and face seem to "lock up".  He felt very lightheaded as if he might pass out.  States that he felt as if he could not talk during the episode, but thought "he was going to die".  He states that he felt as if he was having an out of body experience during the episode.  No shaking or jerking, urinary or fecal incontinence, or tongue biting.  No history of seizures.  No syncope.    He was brought in by EMS and required oxygen in route.  Glucose was noted to be 87.  Heart rate  was 84, blood pressure 138/98, and SpO2 99%.  He was able to remove the oxygen in triage and has not required any additional supplemental O2.  In the ER, he continues to endorse paresthesias, and tremors to the bilateral upper and lower extremities.  Other symptoms, including chest pain have resolved.  He had a similar episode 4 days ago with shortness of breath, chest pain, paresthesias, chest abdominal pain, and blue discoloration in his fingers, but he did not have any locking up of his extremities or face.  He contacted 911 and had to be placed on oxygen for an hour before the symptoms spontaneously resolved.   He also reports that he has been having some nasal congestion and chest congestion over the last few days.  He also feels as if he has not been able to take a deep breath over the last few days.  He has been taking Mucinex with improvement in the symptoms.  No other URI symptoms.  He notes that he did have some cramping in his calves last night, but no other recent muscle cramps.  He was having loose stools earlier today.  No back pain, diaphoresis, fever, chills, cough, palpitations, orthopnea, leg swelling, vomiting, constipation, visual changes, dizziness, weakness, slurred speech, facial droop, headache, or neck pain or stiffness.   He has a history of hyperlipidemia, but is no longer taking fenofibrate.  Reports that he takes multiple vitamins and over-the-counter supplements.  No recent new supplements.  Reports that he did drink 5 shots of caffeine and a  drink from [employer] earlier today, but reports that this is his usual caffeinated beverage.  No recent significant increase in alcohol.  He is unsure if he has a family history of cardiovascular disease due to lack of contact with his family.  No recent long travel.  No personal history of VTE.  He has a history of panic attacks, but states that the 2 episodes that he has had over the last few days are very different from his  panic attacks and he has not had 1 in several years as he follows closely with his therapist.  He reports that he has donated plasma within the last 2 days, but reports that he has been donating regularly for the last 11 years.  The history is provided by the patient. No language interpreter was used.   Hospital Course:  See above  Procedures: 6/14 echocardiogram;Left Ventricle: LVEF= 60 to 65% 6/14 Coronary CTA; negative acute findings -Esophageal air-fluid level suggests dysmotility or GERD      Consultations: Cardiology   Cultures   6/14 SARS coronavirus negative    Discharge Exam: Vitals:   08/21/19 1018 08/21/19 1159 08/21/19 1305 08/21/19 1454  BP: (!) 137/93 (!) 109/94 128/81 120/77  Pulse: 62 70 (!) 58 (!) 57  Resp: 13 16 14 13   Temp:   97.8 F (36.6 C)   TempSrc:   Oral   SpO2: 95% 98% 96% 99%  Weight:      Height:        General: A/O x4 no acute respiratory distress Eyes: negative scleral hemorrhage, negative anisocoria, negative icterus ENT: Negative Runny nose, negative gingival bleeding, Neck:  Negative scars, masses, torticollis, lymphadenopathy, JVD Lungs: Clear to auscultation bilaterally without wheezes or crackles Cardiovascular: Regular rate and rhythm without murmur gallop or rub normal S1 and S2  Discharge Instructions   Allergies as of 08/21/2019   No Known Allergies     Medication List    STOP taking these medications   fenofibrate 145 MG tablet Commonly known as: Tricor     TAKE these medications   ibuprofen 200 MG tablet Commonly known as: ADVIL Take 400 mg by mouth every 6 (six) hours as needed for fever, headache, mild pain, moderate pain or cramping.   loratadine 10 MG tablet Commonly known as: CLARITIN Take 10 mg by mouth daily.   metoprolol tartrate 25 MG tablet Commonly known as: LOPRESSOR Take 1 tablet (25 mg total) by mouth 2 (two) times daily.      No Known Allergies  Follow-up Information    08/23/2019, MD Follow up on 10/27/2019.   Specialties: Cardiology, Radiology Why: at 10:15 AM but come in at 10:00 AM. with her nurse Practitioner Dr. 10/29/2019. Contact information: [address] [place] [phone]                The results of significant diagnostics from this hospitalization (including imaging, microbiology, ancillary and laboratory) are listed below for reference.    Significant Diagnostic Studies: DG Chest 2 View  Result Date: 08/20/2019 CLINICAL DATA:  Shortness of breath EXAM: CHEST - 2 VIEW COMPARISON:  None. FINDINGS: The heart size and mediastinal contours are within normal limits. Both lungs are clear. The visualized skeletal structures are unremarkable. IMPRESSION: No active cardiopulmonary disease. Electronically Signed   By: 08/22/2019 M.D.   On: 08/20/2019 20:34   CT CORONARY MORPH W/CTA COR W/SCORE W/CA W/CM &/OR WO/CM  Addendum Date: 08/21/2019  ADDENDUM REPORT: 08/21/2019 16:35 ADDENDUM: OVER-READ INTERPRETATION  CT CHEST The following report is an over-read performed by radiologist Dr. Arliss Journey [facility] on 08/21/2019. This over-read does not include interpretation of cardiac or coronary anatomy or pathology. The coronary CTA interpretation by the cardiologist is attached. COMPARISON: 08/20/2019 chest radiograph. FINDINGS: No pleural fluid.  Clear imaged lungs. Normal aortic caliber. No central pulmonary embolism, on this non-dedicated study. No imaged thoracic adenopathy. Fluid in the esophagus, including on 37/11. Subcentimeter hepatic dome low-density lesion is likely a cyst. Normal imaged portions of the spleen, stomach. No acute osseous abnormality. IMPRESSION: 1.  No acute findings in the imaged extracardiac chest. 2. Esophageal air fluid level suggests dysmotility or gastroesophageal reflux. Electronically Signed   By: Jeronimo Greaves M.D.   On: 08/21/2019 16:35   Result Date: 08/21/2019 HISTORY:  Chest pain, cardiac cause suspected EXAM: Cardiac/Coronary  CT TECHNIQUE: The patient was scanned on a [employer]. PROTOCOL: A 120 kV prospective scan was triggered in the descending thoracic aorta at 111 HU's. Axial non-contrast 3 mm slices were carried out through the heart. The data set was analyzed on a dedicated work station and scored using the Agatson method. Gantry rotation speed was 250 msecs and collimation was .6 mm. Beta blockade and 0.8 mg of sl NTG was given. The 3D data set was reconstructed in 5% intervals of the 67-82 % of the R-R cycle. Diastolic phases were analyzed on a dedicated work station using MPR, MIP and VRT modes. The patient received of contrast. FINDINGS: Coronary calcium score is 0. Coronary arteries: Normal coronary origins.  Right dominance. Right Coronary Artery: No detectable plaque or stenosis. Left Main Coronary Artery: No detectable plaque or stenosis. Left Anterior Descending Coronary Artery: No detectable plaque or stenosis. Left Circumflex Artery: No detectable plaque or stenosis. Aorta: Dilated ascending aorta. No calcifications.  No dissection. Measurements made in double oblique technique: Sinus of Valsalva: R-L: 44 mm L-Non: 42 mm R-Non: 39 mm Mid ascending aorta (at PA bifurcation): 35 mm Aortic Valve: No calcifications. Other findings: Normal pulmonary vein drainage into the left atrium. Normal left atrial appendage without a thrombus. Normal size of the pulmonary artery. Mild right ventricular chamber enlargement. IMPRESSION: 1. No evidence of CAD, CADRADS = 0. 2. Coronary calcium score of 0. 3. Normal coronary origin with right dominance. 4. 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 Electronically Signed: By: Weston Brass On: 08/21/2019 15:44   ECHOCARDIOGRAM COMPLETE  Result Date: 08/21/2019    ECHOCARDIOGRAM REPORT   Patient Name:   Shane Munoz Date of Exam: 08/21/2019 Medical Rec #:  [id number]      Height:       70.0 in Accession #:    [id number]    Weight:       240.0 lb Date of Birth:  07/10/1980     BSA:          2.255 m Patient Age:    38 years      BP:           137/93 mmHg Patient Gender: M             HR:           57 bpm. Exam Location:  Inpatient Procedure: 2D Echo, Cardiac Doppler, Color Doppler and Intracardiac            Opacification Agent Indications:    Chest Pain 786.50 / R07.9  Elevated Troponin  History:        Patient has no prior history of Echocardiogram examinations.                 Risk Factors:Non-Smoker.  Sonographer:    Renella Cunas RDCS Referring Phys: [id number] Lynda Rainwater A ACHARYA IMPRESSIONS  1. Left ventricular ejection fraction, by estimation, is 60 to 65%. The left ventricle has normal function. The left ventricle has no regional wall motion abnormalities. Left ventricular diastolic parameters were normal.  2. Right ventricular systolic function is normal. The right ventricular size is mildly enlarged. Tricuspid regurgitation signal is inadequate for assessing PA pressure.  3. The mitral valve is normal in structure. No evidence of mitral valve regurgitation.  4. The aortic valve was not well visualized. Aortic valve regurgitation is not visualized. No aortic stenosis is present.  5. The inferior vena cava is normal in size with greater than 50% respiratory variability, suggesting right atrial pressure of 3 mmHg. FINDINGS  Left Ventricle: Left ventricular ejection fraction, by estimation, is 60 to 65%. The left ventricle has normal function. The left ventricle has no regional wall motion abnormalities. Definity contrast agent was given IV to delineate the left ventricular  endocardial borders. The left ventricular internal cavity size was normal in size. There is no left ventricular hypertrophy. Left ventricular diastolic parameters were normal. Right Ventricle: The right ventricular size is mildly enlarged. Right vetricular wall thickness was not assessed. Right  ventricular systolic function is normal. Tricuspid regurgitation signal is inadequate for assessing PA pressure. Left Atrium: Left atrial size was normal in size. Right Atrium: Right atrial size was normal in size. Pericardium: Trivial pericardial effusion is present. Presence of pericardial fat pad. Mitral Valve: The mitral valve is normal in structure. No evidence of mitral valve regurgitation. Tricuspid Valve: The tricuspid valve is normal in structure. Tricuspid valve regurgitation is not demonstrated. Aortic Valve: The aortic valve was not well visualized. Aortic valve regurgitation is not visualized. No aortic stenosis is present. Pulmonic Valve: The pulmonic valve was not well visualized. Pulmonic valve regurgitation is not visualized. Aorta: The aortic root is normal in size and structure. Venous: The inferior vena cava is normal in size with greater than 50% respiratory variability, suggesting right atrial pressure of 3 mmHg. IAS/Shunts: No atrial level shunt detected by color flow Doppler.  LEFT VENTRICLE PLAX 2D LVIDd:         5.00 cm      Diastology LVIDs:         3.20 cm      LV e' lateral:   16.50 cm/s LV PW:         0.90 cm      LV E/e' lateral: 3.2 LV IVS:        0.90 cm      LV e' medial:    9.81 cm/s LVOT diam:     2.20 cm      LV E/e' medial:  5.3 LV SV:         74 LV SV Index:   33 LVOT Area:     3.80 cm  LV Volumes (MOD) LV vol d, MOD A2C: 91.7 ml LV vol d, MOD A4C: 119.0 ml LV vol s, MOD A2C: 28.4 ml LV vol s, MOD A4C: 42.6 ml LV SV MOD A2C:     63.3 ml LV SV MOD A4C:     119.0 ml LV SV MOD BP:      69.9 ml RIGHT VENTRICLE RV S  prime:     17.30 cm/s TAPSE (M-mode): 2.6 cm LEFT ATRIUM             Index       RIGHT ATRIUM           Index LA diam:        4.20 cm 1.86 cm/m  RA Area:     21.30 cm LA Vol (A2C):   43.2 ml 19.15 ml/m RA Volume:   57.00 ml  25.27 ml/m LA Vol (A4C):   34.1 ml 15.12 ml/m LA Biplane Vol: 41.3 ml 18.31 ml/m  AORTIC VALVE LVOT Vmax:   85.10 cm/s LVOT Vmean:  59.600  cm/s LVOT VTI:    0.194 m  AORTA Ao Root diam: 3.30 cm MITRAL VALVE MV Area (PHT): 3.72 cm    SHUNTS MV Decel Time: 204 msec    Systemic VTI:  0.19 m MV E velocity: 52.00 cm/s  Systemic Diam: 2.20 cm MV A velocity: 36.10 cm/s MV E/A ratio:  1.44 Epifanio Lesches MD Electronically signed by Epifanio Lesches MD Signature Date/Time: 08/21/2019/2:16:19 PM    Final     Microbiology: Recent Results (from the past 240 hour(s))  SARS Coronavirus 2 by RT PCR (hospital order, performed in [facility] hospital lab) Nasopharyngeal Nasopharyngeal Swab     Status: None   Collection Time: 08/21/19  4:34 AM   Specimen: Nasopharyngeal Swab  Result Value Ref Range Status   SARS Coronavirus 2 NEGATIVE NEGATIVE Final    Comment: (NOTE) SARS-CoV-2 target nucleic acids are NOT DETECTED.  The SARS-CoV-2 RNA is generally detectable in upper and lower respiratory specimens during the acute phase of infection. The lowest concentration of SARS-CoV-2 viral copies this assay can detect is 250 copies / mL. A negative result does not preclude SARS-CoV-2 infection and should not be used as the sole basis for treatment or other patient management decisions.  A negative result may occur with improper specimen collection / handling, submission of specimen other than nasopharyngeal swab, presence of viral mutation(s) within the areas targeted by this assay, and inadequate number of viral copies (<250 copies / mL). A negative result must be combined with clinical observations, patient history, and epidemiological information.  Fact Sheet for Patients:   BoilerBrush.com.cy  Fact Sheet for Healthcare Providers: https://pope.com/  This test is not yet approved or  cleared by the [place] FDA and has been authorized for detection and/or diagnosis of SARS-CoV-2 by FDA under an Emergency Use Authorization (EUA).  This EUA will remain in effect (meaning this test can  be used) for the duration of the COVID-19 declaration under Section 564(b)(1) of the Act, 21 U.S.C. section 360bbb-3(b)(1), unless the authorization is terminated or revoked sooner.  Performed at [facility], [address], [place], [place] [address]      Labs: Basic Metabolic Panel: Recent Labs  Lab 08/21/19 0112 08/21/19 0126  NA 139  --   K 4.4  --   CL 103  --   CO2 22  --   GLUCOSE 116*  --   BUN 15  --   CREATININE 1.01  --   CALCIUM 8.7*  --   MG  --  2.2   Liver Function Tests: Recent Labs  Lab 08/21/19 0112  AST 21  ALT 24  ALKPHOS 63  BILITOT 0.9  PROT 6.4*  ALBUMIN 3.9   Recent Labs  Lab 08/21/19 0112  LIPASE 26   No results for input(s): AMMONIA in the  last 168 hours. CBC: Recent Labs  Lab 08/21/19 0112  WBC 10.6*  NEUTROABS 7.6  HGB 16.7  HCT 48.7  MCV 84.7  PLT 258   Cardiac Enzymes: Recent Labs  Lab 08/21/19 0112  CKTOTAL 43*   BNP: BNP (last 3 results) No results for input(s): BNP in the last 8760 hours.  ProBNP (last 3 results) No results for input(s): PROBNP in the last 8760 hours.  CBG: No results for input(s): GLUCAP in the last 168 hours.     Signed:  Dia Crawford, MD [facility] [phone] pager

## 2019-08-21 NOTE — Consult Note (Addendum)
Cardiology Consultation:   Patient ID: Shane Munoz MRN: [id number]; DOB: [birth year]  Admit date: 08/20/2019 Date of Consult: 08/21/2019  Primary Care Provider: [name], NP [facility] Cardiologist: No primary care provider on file. new [facility] Electrophysiologist:  None    Patient Profile:   Shane Munoz is a 39 y.o. adult with a hx of PTSD, HLD, dissociative identity disorder, nonbinary adult who is being seen today for the evaluation of chest pain at the request of ER.  History of Present Illness:     [place] with above hx presented to ER with congestion, nasal, and developed trembling and SOB yesterday.  First episodes was Thursday but then symptoms returned yesterday.  Pt admitted to drinking to a lot of caffeine prior to symptoms (5 shots of caffine) though this is normal.  Also nasal congestion and believes the musinex taken had pseudoephedrine.  Pt has a hx of panic attacks but the pt did not feel like this with other episodes.  Wlodarczyk had SOB with pricking in cental chest and upper abd type pain.  +paresthesias in bilateral arms and legs and tingling in face.  Hands turned blue, and felt hands and face were locked up.  + lightheadedness.  No syncope, no fevers, shaking or seizures.   glucose was 87.    Initial EKG SR and no acute changes then pt developed rapid HR at 152 with ST depression inf lat due to rate.  Personally reviewed all EKGs.  No mention if pain with this episode.  TELE: SR to SB  Now with return of chest pain but no EKG changes.  ASA 324 mg given.  CK total 43 Hs Troponin 50 and 44 Na 139, K+ 4.4 Glucose 116, BUN 15, Cr 1.01 Mg+ 2.2 Hgb 16.7 WBC 10.6 plts 258 ddimer 0.30 COVID neg 2VCXR no active disease  BP on arrival 147/98 now 126/81  P 81 R 18  sp02 stable pulse down to 53.  Currently resting comfortable, no pain or SOB.    Past Medical History:  Diagnosis Date  . Alcohol addiction (HCC)    and drug addiction  . Allergy   .  Dissociative identity disorder (HCC)   . PTSD (post-traumatic stress disorder)     Past Surgical History:  Procedure Laterality Date  . MIDDLE EAR SURGERY     tube inside     Home Medications:  Prior to Admission medications   Medication Sig Start Date End Date Taking? Authorizing Provider  ibuprofen (ADVIL) 200 MG tablet Take 400 mg by mouth every 6 (six) hours as needed for fever, headache, mild pain, moderate pain or cramping.   Yes [provider]  loratadine (CLARITIN) 10 MG tablet Take 10 mg by mouth daily.   Yes [provider]  fenofibrate (TRICOR) 145 MG tablet Take 1 tablet (145 mg total) by mouth daily. Patient not taking: Reported on 08/21/2019 10/03/18   Nche, Bonna Gains, NP    Inpatient Medications: Scheduled Meds:  Continuous Infusions:  PRN Meds:   Allergies:   No Known Allergies  Social History:   Social History   Socioeconomic History  . Marital status: Married    Spouse name: Not on file  . Number of children: Not on file  . Years of education: Not on file  . Highest education level: Not on file  Occupational History  . Not on file  Tobacco Use  . Smoking status: Never Smoker  . Smokeless tobacco: Never Used  Vaping Use  .  Vaping Use: Never used  Substance and Sexual Activity  . Alcohol use: Yes    Comment: social  . Drug use: No  . Sexual activity: Yes  Other Topics Concern  . Not on file  Social History Narrative  . Not on file   Social Determinants of Health   Financial Resource Strain:   . Difficulty of Paying Living Expenses:   Food Insecurity:   . Worried About Charity fundraiser in the Last Year:   . Arboriculturist in the Last Year:   Transportation Needs:   . Film/video editor (Medical):   Marland Kitchen Lack of Transportation (Non-Medical):   Physical Activity:   . Days of Exercise per Week:   . Minutes of Exercise per Session:   Stress:   . Feeling of Stress :   Social Connections:   . Frequency of  Communication with Friends and Family:   . Frequency of Social Gatherings with Friends and Family:   . Attends Religious Services:   . Active Member of Clubs or Organizations:   . Attends Archivist Meetings:   Marland Kitchen Marital Status:   Intimate Partner Violence:   . Fear of Current or Ex-Partner:   . Emotionally Abused:   Marland Kitchen Physically Abused:   . Sexually Abused:     Family History:    Family History  Family history unknown: Yes     ROS:  Please see the history of present illness.  General:no colds or fevers, no weight changes Skin:no rashes or ulcers HEENT:no blurred vision, no congestion CV:see HPI PUL:see HPI GI:no diarrhea constipation or melena, no indigestion GU:no hematuria, no dysuria MS:no joint pain, no claudication Neuro:no syncope, no lightheadedness Endo:no diabetes, no thyroid disease  All other ROS reviewed and negative.     Physical Exam/Data:   Vitals:   08/21/19 0730 08/21/19 0731 08/21/19 0732 08/21/19 0733  BP:  131/80    Pulse: (!) 58 (!) 59 60 (!) 56  Resp: 13 16 19  (!) 22  Temp:      TempSrc:      SpO2: 97% 97% 99% 98%  Weight:      Height:       No intake or output data in the 24 hours ending 08/21/19 0755 Last 3 Weights 08/20/2019 08/30/2018 07/12/2017  Weight (lbs) 240 lb 241 lb 9.6 oz 243 lb  Weight (kg) 108.863 kg 109.589 kg 110.224 kg     Body mass index is 34.44 kg/m.  General:  Well nourished, well developed, in no acute distress HEENT: normal Lymph: no adenopathy Neck: no JVD Endocrine:  No thryomegaly Vascular: No carotid bruits; FA pulses 2+ bilaterally without bruits  Cardiac:  normal S1, S2; RRR; no murmur gallup rub or click Lungs:  clear to auscultation bilaterally, no wheezing, rhonchi or rales  Abd: soft, nontender, no hepatomegaly  Ext: no edema Musculoskeletal:  No deformities, BUE and BLE strength normal and equal Skin: warm and dry  Neuro:  Alert and oriented X 3 MAE, follows commands, answers questions  appropriately.  no focal abnormalities noted Psych:  Normal affect with some anxiety    Relevant CV Studies: none  Laboratory Data:  High Sensitivity Troponin:   Recent Labs  Lab 08/21/19 0112 08/21/19 0240  TROPONINIHS 50* 44*     Chemistry Recent Labs  Lab 08/21/19 0112  NA 139  K 4.4  CL 103  CO2 22  GLUCOSE 116*  BUN 15  CREATININE 1.01  CALCIUM 8.7*  GFRNONAA >60  GFRAA >60  ANIONGAP 14    Recent Labs  Lab 08/21/19 0112  PROT 6.4*  ALBUMIN 3.9  AST 21  ALT 24  ALKPHOS 63  BILITOT 0.9   Hematology Recent Labs  Lab 08/21/19 0112  WBC 10.6*  RBC 5.75  HGB 16.7  HCT 48.7  MCV 84.7  MCH 29.0  MCHC 34.3  RDW 12.0  PLT 258   BNPNo results for input(s): BNP, PROBNP in the last 168 hours.  DDimer  Recent Labs  Lab 08/21/19 0240  DDIMER 0.30     Radiology/Studies:  DG Chest 2 View  Result Date: 08/20/2019 CLINICAL DATA:  Shortness of breath EXAM: CHEST - 2 VIEW COMPARISON:  None. FINDINGS: The heart size and mediastinal contours are within normal limits. Both lungs are clear. The visualized skeletal structures are unremarkable. IMPRESSION: No active cardiopulmonary disease. Electronically Signed   By: Alcide Clever M.D.   On: 08/20/2019 20:34       HEAR Score (for undifferentiated chest pain):     3   Assessment and Plan:   1. Initial chest pain in association with abd pain, SOB,  paresthesia of arms, no pain now but with unknown FH and tachycardia and chest pain and hx of anxiety would prefer to know if CAD present - discussed with Dr. [name] and will plan cardiac CTA.  Needs 18ga IV and will add BB 25 mg today with HR in the 50s at times.  NPO   2. Elevated troponin mild 50 and 44 both drawn after tachycardia.   Could evaluate with cardiac CTA -beneficial to know for sure with no FH knowledge.     3. Sinus tach - not noted in vital signs but on EKG at rate 152   4. Panic attack with new symptoms concerning for CAD 5. HLD will check lipids     For questions or updates, please contact [facility] Please consult [email or web address] for contact info under    Signed, Nada Boozer, NP  08/21/2019 7:55 AM   Patient seen and examined with Nada Boozer NP.  Agree as above, with the following exceptions and changes as noted below. Patient Shane Munoz is a pleasant nonbinary 39 year old who presents with chest pain while entering the ER. Felt his body was locking up and now has a constant substernal chest pain. Gen: NAD, CV: RRR, no murmurs, Lungs: clear, Abd: soft, Extrem: Warm, well perfused, no edema, Neuro/Psych: alert and oriented x 3, normal mood and affect. All available labs, radiology testing, previous records reviewed. Findings likely represent panic episode however troponin mildly elevated and recurrent episodes with worsening chest discomfort, albeit atypical. Will perform an echocardiogram to evaluate for structural heart disease and r/o congenital lesions, and CCTA for assessment of coronary luminal stenosis.   Shane Poisson, MD 08/21/19 11:09 AM
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
## 2019-08-21 NOTE — Progress Notes (Signed)
Pt's echo and cardiac cTA were normal.  All reassuring, rapid HR may have been cause of mildly elevated troponin.  From cardiology standpoint could be discharged and will follow up with Achayra

## 2019-08-21 NOTE — Progress Notes (Signed)
  Echocardiogram 2D Echocardiogram has been performed.  Burnard Hawthorne 08/21/2019, 12:47 PM

## 2019-08-23 ENCOUNTER — Other Ambulatory Visit: Payer: Self-pay

## 2019-08-24 ENCOUNTER — Encounter: Payer: Self-pay | Admitting: Nurse Practitioner

## 2019-08-24 ENCOUNTER — Ambulatory Visit (INDEPENDENT_AMBULATORY_CARE_PROVIDER_SITE_OTHER): Payer: 59 | Admitting: Nurse Practitioner

## 2019-08-24 VITALS — BP 118/80 | HR 55 | Temp 97.0°F | Ht 70.0 in | Wt 231.2 lb

## 2019-08-24 DIAGNOSIS — K2100 Gastro-esophageal reflux disease with esophagitis, without bleeding: Secondary | ICD-10-CM

## 2019-08-24 DIAGNOSIS — F411 Generalized anxiety disorder: Secondary | ICD-10-CM

## 2019-08-24 DIAGNOSIS — Z8659 Personal history of other mental and behavioral disorders: Secondary | ICD-10-CM | POA: Diagnosis not present

## 2019-08-24 MED ORDER — OMEPRAZOLE 20 MG PO CPDR: DELAYED_RELEASE_CAPSULE | ORAL | 0 refills | Status: DC

## 2019-08-24 MED ORDER — VENLAFAXINE HCL ER 37.5 MG PO CP24: 37.5000 mg | ORAL_CAPSULE | Freq: Every day | ORAL | 5 refills | Status: DC

## 2019-08-24 NOTE — Patient Instructions (Addendum)
Schedule sooner appt with therapist. F/up in 41month.  Managing Anxiety, Adult After being diagnosed with an anxiety disorder, you may be relieved to know why you have felt or behaved a certain way. You may also feel overwhelmed about the treatment ahead and what it will mean for your life. With care and support, you can manage this condition and recover from it. How to manage lifestyle changes Managing stress and anxiety  Stress is your body's reaction to life changes and events, both good and bad. Most stress will last just a few hours, but stress can be ongoing and can lead to more than just stress. Although stress can play a major role in anxiety, it is not the same as anxiety. Stress is usually caused by something external, such as a deadline, test, or competition. Stress normally passes after the triggering event has ended.  Anxiety is caused by something internal, such as imagining a terrible outcome or worrying that something will go wrong that will devastate you. Anxiety often does not go away even after the triggering event is over, and it can become long-term (chronic) worry. It is important to understand the differences between stress and anxiety and to manage your stress effectively so that it does not lead to an anxious response. Talk with your health care provider or a counselor to learn more about reducing anxiety and stress. He or she may suggest tension reduction techniques, such as:  Music therapy. This can include creating or listening to music that you enjoy and that inspires you.  Mindfulness-based meditation. This involves being aware of your normal breaths while not trying to control your breathing. It can be done while sitting or walking.  Centering prayer. This involves focusing on a word, phrase, or sacred image that means something to you and brings you peace.  Deep breathing. To do this, expand your stomach and inhale slowly through your nose. Hold your breath for 3-5  seconds. Then exhale slowly, letting your stomach muscles relax.  Self-talk. This involves identifying thought patterns that lead to anxiety reactions and changing those patterns.  Muscle relaxation. This involves tensing muscles and then relaxing them. Choose a tension reduction technique that suits your lifestyle and personality. These techniques take time and practice. Set aside 5-15 minutes a day to do them. Therapists can offer counseling and training in these techniques. The training to help with anxiety may be covered by some insurance plans. Other things you can do to manage stress and anxiety include:  Keeping a stress/anxiety diary. This can help you learn what triggers your reaction and then learn ways to manage your response.  Thinking about how you react to certain situations. You may not be able to control everything, but you can control your response.  Making time for activities that help you relax and not feeling guilty about spending your time in this way.  Visual imagery and yoga can help you stay calm and relax.  Medicines Medicines can help ease symptoms. Medicines for anxiety include:  Anti-anxiety drugs.  Antidepressants. Medicines are often used as a primary treatment for anxiety disorder. Medicines will be prescribed by a health care provider. When used together, medicines, psychotherapy, and tension reduction techniques may be the most effective treatment. Relationships Relationships can play a big part in helping you recover. Try to spend more time connecting with trusted friends and family members. Consider going to couples counseling, taking family education classes, or going to family therapy. Therapy can help you and others better  understand your condition. How to recognize changes in your anxiety Everyone responds differently to treatment for anxiety. Recovery from anxiety happens when symptoms decrease and stop interfering with your daily activities at home or  work. This may mean that you will start to:  Have better concentration and focus. Worry will interfere less in your daily thinking.  Sleep better.  Be less irritable.  Have more energy.  Have improved memory. It is important to recognize when your condition is getting worse. Contact your health care provider if your symptoms interfere with home or work and you feel like your condition is not improving. Follow these instructions at home: Activity  Exercise. Most adults should do the following: ? Exercise for at least 150 minutes each week. The exercise should increase your heart rate and make you sweat (moderate-intensity exercise). ? Strengthening exercises at least twice a week.  Get the right amount and quality of sleep. Most adults need 7-9 hours of sleep each night. Lifestyle   Eat a healthy diet that includes plenty of vegetables, fruits, whole grains, low-fat dairy products, and lean protein. Do not eat a lot of foods that are high in solid fats, added sugars, or salt.  Make choices that simplify your life.  Do not use any products that contain nicotine or tobacco, such as cigarettes, e-cigarettes, and chewing tobacco. If you need help quitting, ask your health care provider.  Avoid caffeine, alcohol, and certain over-the-counter cold medicines. These may make you feel worse. Ask your pharmacist which medicines to avoid. General instructions  Take over-the-counter and prescription medicines only as told by your health care provider.  Keep all follow-up visits as told by your health care provider. This is important. Where to find support You can get help and support from these sources:  Self-help groups.  Online and [employer].  A trusted spiritual leader.  Couples counseling.  Family education classes.  Family therapy. Where to find more information You may find that joining a support group helps you deal with your anxiety. The following sources  can help you locate counselors or support groups near you:  [place]: www.mentalhealth[place].net  Anxiety and Depression Association of [place] (ADAA): https://www.clark.net/  National Alliance on Mental Illness (NAMI): www.nami.org Contact a health care provider if you:  Have a hard time staying focused or finishing daily tasks.  Spend many hours a day feeling worried about everyday life.  Become exhausted by worry.  Start to have headaches, feel tense, or have nausea.  Urinate more than normal.  Have diarrhea. Get help right away if you have:  A racing heart and shortness of breath.  Thoughts of hurting yourself or others. If you ever feel like you may hurt yourself or others, or have thoughts about taking your own life, get help right away. You can go to your nearest emergency department or call:  Your local emergency services (911 in the [place]  A suicide crisis helpline, such as the [facility] at (303)495-0840. This is open 24 hours a day. Summary  Taking steps to learn and use tension reduction techniques can help calm you and help prevent triggering an anxiety reaction.  When used together, medicines, psychotherapy, and tension reduction techniques may be the most effective treatment.  Family, friends, and partners can play a big part in helping you recover from an anxiety disorder. This information is not intended to replace advice given to you by your health care provider. Make sure you discuss any questions  you have with your health care provider. Document Revised: 07/26/2018 Document Reviewed: 07/26/2018 Elsevier Patient Education  2020 [employer].
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
## 2019-08-27 ENCOUNTER — Encounter: Payer: Self-pay | Admitting: Nurse Practitioner

## 2019-08-30 ENCOUNTER — Other Ambulatory Visit: Payer: Self-pay

## 2019-08-30 ENCOUNTER — Ambulatory Visit (INDEPENDENT_AMBULATORY_CARE_PROVIDER_SITE_OTHER): Payer: 59 | Admitting: Nurse Practitioner

## 2019-08-30 ENCOUNTER — Encounter: Payer: Self-pay | Admitting: Nurse Practitioner

## 2019-08-30 VITALS — BP 128/80 | HR 56 | Temp 96.8°F | Ht 70.0 in | Wt 226.8 lb

## 2019-08-30 DIAGNOSIS — K2100 Gastro-esophageal reflux disease with esophagitis, without bleeding: Secondary | ICD-10-CM | POA: Diagnosis not present

## 2019-08-30 DIAGNOSIS — K219 Gastro-esophageal reflux disease without esophagitis: Secondary | ICD-10-CM | POA: Insufficient documentation

## 2019-08-30 DIAGNOSIS — R110 Nausea: Secondary | ICD-10-CM

## 2019-08-30 MED ORDER — OMEPRAZOLE 40 MG PO CPDR: 40.0000 mg | DELAYED_RELEASE_CAPSULE | Freq: Two times a day (BID) | ORAL | 0 refills | Status: DC

## 2019-08-30 NOTE — Patient Instructions (Signed)
Increase omeprazole to 40mg  BID x 7days, then down to 20mg  daily Continue effexor. You will be contacted to schedule appt for ABD   Food Choices for Gastroesophageal Reflux Disease, Adult When you have gastroesophageal reflux disease (GERD), the foods you eat and your eating habits are very important. Choosing the right foods can help ease your discomfort. Think about working with a nutrition specialist (dietitian) to help you make good choices. What are tips for following this plan?  Meals  Choose healthy foods that are low in fat, such as fruits, vegetables, whole grains, low-fat dairy products, and lean meat, fish, and poultry.  Eat small meals often instead of 3 large meals a day. Eat your meals slowly, and in a place where you are relaxed. Avoid bending over or lying down until 2-3 hours after eating.  Avoid eating meals 2-3 hours before bed.  Avoid drinking a lot of liquid with meals.  Cook foods using methods other than frying. Bake, grill, or broil food instead.  Avoid or limit: ? Chocolate. ? Peppermint or spearmint. ? Alcohol. ? Pepper. ? Black and decaffeinated coffee. ? Black and decaffeinated tea. ? Bubbly (carbonated) soft drinks. ? Caffeinated energy drinks and soft drinks.  Limit high-fat foods such as: ? Fatty meat or fried foods. ? Whole milk, cream, butter, or ice cream. ? Nuts and nut butters. ? Pastries, donuts, and sweets made with butter or shortening.  Avoid foods that cause symptoms. These foods may be different for everyone. Common foods that cause symptoms include: ? Tomatoes. ? Oranges, lemons, and limes. ? Peppers. ? Spicy food. ? Onions and garlic. ? Vinegar. Lifestyle  Maintain a healthy weight. Ask your doctor what weight is healthy for you. If you need to lose weight, work with your doctor to do so safely.  Exercise for at least 30 minutes for 5 or more days each week, or as told by your doctor.  Wear loose-fitting clothes.  Do  not smoke. If you need help quitting, ask your doctor.  Sleep with the head of your bed higher than your feet. Use a wedge under the mattress or blocks under the bed frame to raise the head of the bed. Summary  When you have gastroesophageal reflux disease (GERD), food and lifestyle choices are very important in easing your symptoms.  Eat small meals often instead of 3 large meals a day. Eat your meals slowly, and in a place where you are relaxed.  Limit high-fat foods such as fatty meat or fried foods.  Avoid bending over or lying down until 2-3 hours after eating.  Avoid peppermint and spearmint, caffeine, alcohol, and chocolate. This information is not intended to replace advice given to you by your health care provider. Make sure you discuss any questions you have with your health care provider. Document Revised: 06/16/2018 Document Reviewed: 03/31/2016 Elsevier Patient Education  2020 08/16/2018.

## 2019-08-30 NOTE — Progress Notes (Signed)
Subjective:  Patient ID: Shane Munoz, adult    DOB: [birth year]  Age: 39 y.o. MRN: [id number]  CC: Acute Visit (pt dropped 4-5lb since last visit-but loss of appetite//thought GERD related but anxiety//worse at night)   Accompanied by wife HPI Gastroesophageal Reflux Shane Munoz complains of choking, coughing, dysphagia, globus sensation, heartburn and nausea. Shane Munoz reports no abdominal pain, no belching, no chest pain, no early satiety, no hoarse voice, no sore throat, no stridor, no tooth decay, no water brash or no wheezing. This is a new problem. The current episode started 1 to 4 weeks ago. The problem has been gradually improving. The heartburn is located in the substernum. The heartburn is of moderate intensity. The heartburn does not wake Shane Munoz from sleep. The heartburn does not limit Shane Munoz's activity. The heartburn doesn't change with position. The symptoms are aggravated by certain foods. Pertinent negatives include no anemia, fatigue, melena, muscle weakness or orthopnea. Risk factors include obesity. Shane Munoz has tried a PPI for the symptoms. The treatment provided mild relief. Past procedures do not include an abdominal ultrasound, an EGD, esophageal manometry or a UGI.   Reviewed past Medical, Social and Family history today.  Outpatient Medications Prior to Visit  Medication Sig Dispense Refill  . anastrozole (ARIMIDEX) 1 MG tablet Take 1 mg by mouth daily.    Marland Kitchen ibuprofen (ADVIL) 200 MG tablet Take 400 mg by mouth every 6 (six) hours as needed for fever, headache, mild pain, moderate pain or cramping.    . loratadine (CLARITIN) 10 MG tablet Take 10 mg by mouth daily.    Marland Kitchen venlafaxine XR (EFFEXOR-XR) 37.5 MG 24 hr capsule Take 1 capsule (37.5 mg total) by mouth daily with breakfast. 30 capsule 5  . omeprazole (PRILOSEC) 20 MG capsule 1tab BID x 1week, then 1tab daily till complete 28 capsule 0   No facility-administered medications prior to visit.      ROS See HPI  Objective:  BP 128/80   Pulse (!) 56   Temp (!) 96.8 F (36 C) (Tympanic)   Ht 5\' 10"  (1.778 m)   Wt 226 lb 12.8 oz (102.9 kg)   SpO2 98%   BMI 32.54 kg/m   BP Readings from Last 3 Encounters:  08/30/19 128/80  08/24/19 118/80  08/21/19 120/77    Wt Readings from Last 3 Encounters:  08/30/19 226 lb 12.8 oz (102.9 kg)  08/24/19 231 lb 3.2 oz (104.9 kg)  08/20/19 240 lb (108.9 kg)   Physical Exam Physical Exam Vitals reviewed.  Constitutional:      Appearance: Shane Munoz is obese.  Cardiovascular:     Rate and Rhythm: Normal rate.     Pulses: Normal pulses.  Pulmonary:     Effort: Pulmonary effort is normal.     Breath sounds: Normal breath sounds.  Abdominal:     General: Bowel sounds are normal. There is no distension.     Palpations: Abdomen is soft.     Tenderness: There is no abdominal tenderness. There is no right CVA tenderness, left CVA tenderness or guarding.  Neurological:     Mental Status: Shane Munoz is alert and oriented to person, place, and time.  Psychiatric:        Mood and Affect: Mood normal.        Behavior: Behavior normal.        Thought Content: Thought content normal.    Lab Results  Component Value Date   WBC 10.6 (H) 08/21/2019  HGB 16.7 08/21/2019   HCT 48.7 08/21/2019   PLT 258 08/21/2019   GLUCOSE 116 (H) 08/21/2019   CHOL 187 08/21/2019   TRIG 150 (H) 08/21/2019   HDL 34 (L) 08/21/2019   LDLCALC 123 (H) 08/21/2019   ALT 24 08/21/2019   AST 21 08/21/2019   NA 139 08/21/2019   K 4.4 08/21/2019   CL 103 08/21/2019   CREATININE 1.01 08/21/2019   BUN 15 08/21/2019   CO2 22 08/21/2019   TSH 1.138 08/21/2019   HGBA1C 5.5 07/12/2017   Assessment & Plan:  This visit occurred during the SARS-CoV-2 public health emergency.  Safety protocols were in place, including screening questions prior to the visit, additional usage of staff PPE, and extensive cleaning of exam room while observing appropriate  contact time as indicated for disinfecting solutions.   Shane Munoz was seen today for acute visit.  Diagnoses and all orders for this visit:  Nausea -     US Abdomen Limited RUQ; Future  Gastroesophageal reflux disease with esophagitis without hemorrhage -     omeprazole (PRILOSEC) 40 MG capsule; Take 1 capsule (40 mg total) by mouth 2 (two) times daily before a meal.   I have changed Shane Munoz's omeprazole. I am also having Shane Munoz maintain Shane Munoz's ibuprofen, loratadine, venlafaxine XR, and anastrozole.  Meds ordered this encounter  Medications  . omeprazole (PRILOSEC) 40 MG capsule    Sig: Take 1 capsule (40 mg total) by mouth 2 (two) times daily before a meal.    Dispense:  14 capsule    Refill:  0    Order Specific Question:   Supervising Provider    Answer:   Shane Munoz [[id number]]    Problem List Items Addressed This Visit    None    Visit Diagnoses    Nausea    -  Primary   Relevant Orders   US Abdomen Limited RUQ   Gastroesophageal reflux disease with esophagitis without hemorrhage       Relevant Medications   omeprazole (PRILOSEC) 40 MG capsule      Follow-up: Return in about 1 week (around 09/06/2019) for anxiety and GERD (video, 65mins).  Wilfred Lacy, NP

## 2019-08-30 NOTE — Assessment & Plan Note (Signed)
Increase omeprazole to 40mg  BID x 7week Ordered ABD Advised about diet modifications needed, printed material given. Ref to GI if no improvement and ABD [place] if normal.

## 2019-08-31 ENCOUNTER — Ambulatory Visit
Admission: RE | Admit: 2019-08-31 | Discharge: 2019-08-31 | Disposition: A | Discharge status: Home / Self Care | Payer: 59 | Source: Ambulatory Visit | Attending: Nurse Practitioner | Admitting: Nurse Practitioner

## 2019-08-31 DIAGNOSIS — R110 Nausea: Secondary | ICD-10-CM

## 2019-09-06 ENCOUNTER — Other Ambulatory Visit: Payer: Self-pay

## 2019-09-06 ENCOUNTER — Telehealth (INDEPENDENT_AMBULATORY_CARE_PROVIDER_SITE_OTHER): Payer: 59 | Admitting: Nurse Practitioner

## 2019-09-06 ENCOUNTER — Encounter: Payer: Self-pay | Admitting: Nurse Practitioner

## 2019-09-06 VITALS — BP 135/85 | HR 57 | Temp 96.3°F | Ht 70.0 in | Wt 224.0 lb

## 2019-09-06 DIAGNOSIS — F411 Generalized anxiety disorder: Secondary | ICD-10-CM | POA: Diagnosis not present

## 2019-09-06 DIAGNOSIS — K2100 Gastro-esophageal reflux disease with esophagitis, without bleeding: Secondary | ICD-10-CM | POA: Diagnosis not present

## 2019-09-06 MED ORDER — OMEPRAZOLE 20 MG PO CPDR: 20.0000 mg | DELAYED_RELEASE_CAPSULE | Freq: Two times a day (BID) | ORAL | 0 refills | Status: DC

## 2019-09-06 NOTE — Patient Instructions (Addendum)
Decrease omeprazole to 20mg  BID. Maintain GERD diet Continue effexor F/up in 30month as scheduled.   Food Choices for Gastroesophageal Reflux Disease, Adult When you have gastroesophageal reflux disease (GERD), the foods you eat and your eating habits are very important. Choosing the right foods can help ease your discomfort. Think about working with a nutrition specialist (dietitian) to help you make good choices. What are tips for following this plan?  Meals  Choose healthy foods that are low in fat, such as fruits, vegetables, whole grains, low-fat dairy products, and lean meat, fish, and poultry.  Eat small meals often instead of 3 large meals a day. Eat your meals slowly, and in a place where you are relaxed. Avoid bending over or lying down until 2-3 hours after eating.  Avoid eating meals 2-3 hours before bed.  Avoid drinking a lot of liquid with meals.  Cook foods using methods other than frying. Bake, grill, or broil food instead.  Avoid or limit: ? Chocolate. ? Peppermint or spearmint. ? Alcohol. ? Pepper. ? Black and decaffeinated coffee. ? Black and decaffeinated tea. ? Bubbly (carbonated) soft drinks. ? Caffeinated energy drinks and soft drinks.  Limit high-fat foods such as: ? Fatty meat or fried foods. ? Whole milk, cream, butter, or ice cream. ? Nuts and nut butters. ? Pastries, donuts, and sweets made with butter or shortening.  Avoid foods that cause symptoms. These foods may be different for everyone. Common foods that cause symptoms include: ? Tomatoes. ? Oranges, lemons, and limes. ? Peppers. ? Spicy food. ? Onions and garlic. ? Vinegar. Lifestyle  Maintain a healthy weight. Ask your doctor what weight is healthy for you. If you need to lose weight, work with your doctor to do so safely.  Exercise for at least 30 minutes for 5 or more days each week, or as told by your doctor.  Wear loose-fitting clothes.  Do not smoke. If you need help  quitting, ask your doctor.  Sleep with the head of your bed higher than your feet. Use a wedge under the mattress or blocks under the bed frame to raise the head of the bed. Summary  When you have gastroesophageal reflux disease (GERD), food and lifestyle choices are very important in easing your symptoms.  Eat small meals often instead of 3 large meals a day. Eat your meals slowly, and in a place where you are relaxed.  Limit high-fat foods such as fatty meat or fried foods.  Avoid bending over or lying down until 2-3 hours after eating.  Avoid peppermint and spearmint, caffeine, alcohol, and chocolate. This information is not intended to replace advice given to you by your health care provider. Make sure you discuss any questions you have with your health care provider. Document Revised: 06/16/2018 Document Reviewed: 03/31/2016 Elsevier Patient Education  2020 04/02/2016.

## 2019-09-06 NOTE — Progress Notes (Signed)
Virtual Visit via Video Note  I connected with@ on 09/06/19 at 11:30 AM EDT by a video enabled telemedicine application and verified that I am speaking with the correct person using two identifiers.  Location: Patient:Home Provider: Office Participants: patient and provider  I discussed the limitations of evaluation and management by telemedicine and the availability of in person appointments. I also discussed with the patient that there may be a patient responsible charge related to this service. The patient expressed understanding and agreed to proceed.  CC:1 week for anxiety and GERD-  History of Present Illness: pt states he feels anxiety is improved an states no panic attacks in last week GERD is improving//states mouth stays dry and is wondering if he needs a refill for the increased dose he took his last one of those. He denies any adverse side effects with current medications   Observations/Objective: Physical Exam Vitals reviewed.  Constitutional:      General: Shane Munoz is not in acute distress. Cardiovascular:     Rate and Rhythm: Normal rate.     Pulses: Normal pulses.  Neurological:     Mental Status: Shane Munoz is alert and oriented to person, place, and time.  Psychiatric:        Mood and Affect: Mood normal.        Behavior: Behavior normal.        Thought Content: Thought content normal.     Assessment and Plan: Shane Munoz was seen today for follow-up.  Diagnoses and all orders for this visit:  GAD (generalized anxiety disorder)  Gastroesophageal reflux disease with esophagitis without hemorrhage -     omeprazole (PRILOSEC) 20 MG capsule; Take 1 capsule (20 mg total) by mouth 2 (two) times daily before a meal.   Follow Up Instructions: Decrease omeprazole to 20mg  BID Maintain GERD diet Continue effexor F/up in 39month as scheduled.   I discussed the assessment and treatment plan with the patient. The patient was provided an opportunity to ask  questions and all were answered. The patient agreed with the plan and demonstrated an understanding of the instructions.   The patient was advised to call back or seek an in-person evaluation if the symptoms worsen or if the condition fails to improve as anticipated.  2month, NP

## 2019-09-27 ENCOUNTER — Other Ambulatory Visit: Payer: Self-pay

## 2019-09-28 ENCOUNTER — Ambulatory Visit (INDEPENDENT_AMBULATORY_CARE_PROVIDER_SITE_OTHER): Payer: 59 | Admitting: Nurse Practitioner

## 2019-09-28 ENCOUNTER — Encounter: Payer: Self-pay | Admitting: Nurse Practitioner

## 2019-09-28 VITALS — BP 128/72 | HR 67 | Temp 97.4°F | Ht 70.0 in | Wt 220.2 lb

## 2019-09-28 DIAGNOSIS — E785 Hyperlipidemia, unspecified: Secondary | ICD-10-CM

## 2019-09-28 DIAGNOSIS — F411 Generalized anxiety disorder: Secondary | ICD-10-CM | POA: Diagnosis not present

## 2019-09-28 DIAGNOSIS — K2100 Gastro-esophageal reflux disease with esophagitis, without bleeding: Secondary | ICD-10-CM

## 2019-09-28 DIAGNOSIS — R739 Hyperglycemia, unspecified: Secondary | ICD-10-CM

## 2019-09-28 MED ORDER — PANTOPRAZOLE SODIUM 40 MG PO TBEC: 40.0000 mg | DELAYED_RELEASE_TABLET | Freq: Every day | ORAL | 0 refills | Status: DC

## 2019-09-28 NOTE — Assessment & Plan Note (Signed)
Improved with effexor Denies an adverse side effects.  Maintain medication F/up in 92month

## 2019-09-28 NOTE — Assessment & Plan Note (Addendum)
Minimal improvement with omeprazole 20 and 40mg  BID x 2weeks. He has also made changes to his diet (low fat, no caffeine or tobacco or ETOH use), last meal is 3hrs prior to bedtime. Normal ABD No dysphagia or anemia Normal TSH and negative enlarged thyroid on exam Some weight loss noted, but this is intentional Wt Readings from Last 3 Encounters:  09/28/19 (!) 220 lb 3.2 oz (99.9 kg)  09/06/19 224 lb (101.6 kg)  08/30/19 226 lb 12.8 oz (102.9 kg)   Change omeprazole to pantoprazole Check stool for H.pylori If negative, ref to GI

## 2019-09-28 NOTE — Progress Notes (Signed)
Subjective:  Patient ID: Shane Munoz, adult    DOB: 1980-07-17  Age: 39 y.o. MRN: [id number]  CC: Follow-up (4 week f/up for anxiety-he thinks its doing better--states GERD is still there an throat looked red when he looked)  HPI  Gastroesophageal reflux disease with esophagitis without hemorrhage Minimal improvement with omeprazole 20 and 40mg  BID x 2weeks. He has also made changes to his diet (low fat, no caffeine or tobacco or ETOH use), last meal is 3hrs prior to bedtime. Normal ABD No dysphagia or anemia Some weight loss noted, but this is intentional Wt Readings from Last 3 Encounters:  09/28/19 (!) 220 lb 3.2 oz (99.9 kg)  09/06/19 224 lb (101.6 kg)  08/30/19 226 lb 12.8 oz (102.9 kg)   Change omeprazole to pantoprazole Check stool for H.pylori If negative, ref to GI  GAD (generalized anxiety disorder) Improved with effexor Denies an adverse side effects.  Maintain medication F/up in 88month  Dyslipidemia lifestyle changes implemented in last 55months (walking for exercise and DASH diet).  Repeat lipid panel today   Reviewed past Medical, Social and Family history today.  Outpatient Medications Prior to Visit  Medication Sig Dispense Refill  . anastrozole (ARIMIDEX) 1 MG tablet Take 1 mg by mouth daily.    1month loratadine (CLARITIN) 10 MG tablet Take 10 mg by mouth daily.    Marland Kitchen venlafaxine XR (EFFEXOR-XR) 37.5 MG 24 hr capsule Take 1 capsule (37.5 mg total) by mouth daily with breakfast. 30 capsule 5  . omeprazole (PRILOSEC) 20 MG capsule Take 1 capsule (20 mg total) by mouth 2 (two) times daily before a meal. 28 capsule 0   No facility-administered medications prior to visit.    ROS See HPI  Objective:  BP 128/72   Pulse 67   Temp (!) 97.4 F (36.3 C) (Tympanic)   Ht 5\' 10"  (1.778 m)   Wt (!) 220 lb 3.2 oz (99.9 kg)   SpO2 97%   BMI 31.60 kg/m   Physical Exam Constitutional:      Appearance: Shane Munoz is obese.  HENT:     Mouth/Throat:      Pharynx: Uvula midline. No pharyngeal swelling, oropharyngeal exudate, posterior oropharyngeal erythema or uvula swelling.  Neck:     Thyroid: No thyroid mass, thyromegaly or thyroid tenderness.  Cardiovascular:     Rate and Rhythm: Normal rate.     Pulses: Normal pulses.  Pulmonary:     Effort: Pulmonary effort is normal.  Lymphadenopathy:     Cervical:     Right cervical: No superficial, deep or posterior cervical adenopathy.    Left cervical: No superficial, deep or posterior cervical adenopathy.  Neurological:     Mental Status: Shane Munoz is alert and oriented to person, place, and time.  Psychiatric:        Mood and Affect: Mood normal.        Behavior: Behavior normal.        Thought Content: Thought content normal.    Assessment & Plan:  This visit occurred during the SARS-CoV-2 public health emergency.  Safety protocols were in place, including screening questions prior to the visit, additional usage of staff PPE, and extensive cleaning of exam room while observing appropriate contact time as indicated for disinfecting solutions.   Shane Munoz was seen today for follow-up.  Diagnoses and all orders for this visit:  GAD (generalized anxiety disorder)  Gastroesophageal reflux disease with esophagitis without hemorrhage -     H. pylori antigen, stool;  Future -     pantoprazole (PROTONIX) 40 MG tablet; Take 1 tablet (40 mg total) by mouth daily.  Hyperglycemia -     Hemoglobin A1c; Future  Dyslipidemia -     Lipid panel; Future    Problem List Items Addressed This Visit      Digestive   Gastroesophageal reflux disease with esophagitis without hemorrhage    Minimal improvement with omeprazole 20 and 40mg  BID x 2weeks. He has also made changes to his diet (low fat, no caffeine or tobacco or ETOH use), last meal is 3hrs prior to bedtime. Normal ABD No dysphagia or anemia Some weight loss noted, but this is intentional Wt Readings from Last 3 Encounters:    09/28/19 (!) 220 lb 3.2 oz (99.9 kg)  09/06/19 224 lb (101.6 kg)  08/30/19 226 lb 12.8 oz (102.9 kg)   Change omeprazole to pantoprazole Check stool for H.pylori If negative, ref to GI      Relevant Medications   pantoprazole (PROTONIX) 40 MG tablet   Other Relevant Orders   H. pylori antigen, stool     Other   Dyslipidemia    lifestyle changes implemented in last 53months (walking for exercise and DASH diet).  Repeat lipid panel today      Relevant Orders   Lipid panel   GAD (generalized anxiety disorder) - Primary    Improved with effexor Denies an adverse side effects.  Maintain medication F/up in 3month      Hyperglycemia   Relevant Orders   Hemoglobin A1c      Follow-up: Return in about 4 weeks (around 10/26/2019) for Anxiety (video, 10/28/2019, complete PHQ/GAD).  , NP

## 2019-09-28 NOTE — Patient Instructions (Signed)
Go to lab for stool kit. Schedule lab appt (need to be fasting at least 6-8hrs prior to blood draw).  Continue effexor Maintain appts with psychologist. Start pantoprazole. If negative stool test, will refer to GI.

## 2019-09-28 NOTE — Assessment & Plan Note (Signed)
lifestyle changes implemented in last 47months (walking for exercise and DASH diet).  Repeat lipid panel today

## 2019-09-29 ENCOUNTER — Other Ambulatory Visit: Payer: Self-pay

## 2019-09-29 ENCOUNTER — Other Ambulatory Visit (INDEPENDENT_AMBULATORY_CARE_PROVIDER_SITE_OTHER): Payer: 59

## 2019-09-29 DIAGNOSIS — K2100 Gastro-esophageal reflux disease with esophagitis, without bleeding: Secondary | ICD-10-CM

## 2019-09-29 DIAGNOSIS — E785 Hyperlipidemia, unspecified: Secondary | ICD-10-CM | POA: Diagnosis not present

## 2019-09-29 DIAGNOSIS — R739 Hyperglycemia, unspecified: Secondary | ICD-10-CM

## 2019-09-29 LAB — LIPID PANEL
Cholesterol: 154 mg/dL (ref 0–200)
HDL: 35.4 mg/dL — ABNORMAL LOW (ref >39.00)
LDL Cholesterol: 95 mg/dL (ref 0–99)
NonHDL: 118.2
Total CHOL/HDL Ratio: 4
Triglycerides: 116 mg/dL (ref 0.0–149.0)
VLDL: 23.2 mg/dL (ref 0.0–40.0)

## 2019-09-29 LAB — HEMOGLOBIN A1C: Hgb A1c MFr Bld: 5.4 % (ref 4.6–6.5)

## 2019-10-03 LAB — HELICOBACTER PYLORI  SPECIAL ANTIGEN
MICRO NUMBER:: 10742774
SPECIMEN QUALITY: ADEQUATE

## 2019-10-03 NOTE — Addendum Note (Signed)
Addended by: Alysia Penna L on: 10/03/2019 07:50 AM   Modules accepted: Orders

## 2019-10-05 ENCOUNTER — Encounter: Payer: Self-pay | Admitting: Nurse Practitioner

## 2019-10-06 ENCOUNTER — Encounter: Payer: Self-pay | Admitting: Nurse Practitioner

## 2019-10-24 ENCOUNTER — Encounter: Payer: Self-pay | Admitting: Nurse Practitioner

## 2019-10-24 DIAGNOSIS — K2100 Gastro-esophageal reflux disease with esophagitis, without bleeding: Secondary | ICD-10-CM

## 2019-10-24 MED ORDER — PANTOPRAZOLE SODIUM 20 MG PO TBEC: 20.0000 mg | DELAYED_RELEASE_TABLET | Freq: Every day | ORAL | 0 refills | Status: DC

## 2019-10-27 ENCOUNTER — Ambulatory Visit (INDEPENDENT_AMBULATORY_CARE_PROVIDER_SITE_OTHER): Payer: 59 | Admitting: Physician Assistant

## 2019-10-27 ENCOUNTER — Other Ambulatory Visit: Payer: Self-pay

## 2019-10-27 ENCOUNTER — Encounter: Payer: Self-pay | Admitting: Physician Assistant

## 2019-10-27 VITALS — BP 128/70 | HR 64 | Ht 70.0 in | Wt 217.0 lb

## 2019-10-27 DIAGNOSIS — I7789 Other specified disorders of arteries and arterioles: Secondary | ICD-10-CM | POA: Diagnosis not present

## 2019-10-27 DIAGNOSIS — R079 Chest pain, unspecified: Secondary | ICD-10-CM

## 2019-10-27 NOTE — Patient Instructions (Signed)
Medication Instructions:  Continue same medications    Lab Work: None ordered    Testing/Procedures: None ordered   Follow-Up: At [facility], you and your health needs are our priority.  As part of our continuing mission to provide you with exceptional heart care, we have created designated Provider Care Teams.  These Care Teams include your primary Cardiologist (physician) and Advanced Practice Providers (APPs -  Physician Assistants and Nurse Practitioners) who all work together to provide you with the care you need, when you need it.  We recommend signing up for the patient portal called "MyChart".  Sign up information is provided on this After Visit Summary.  MyChart is used to connect with patients for Virtual Visits (Telemedicine).  Patients are able to view lab/test results, encounter notes, upcoming appointments, etc.  Non-urgent messages can be sent to your provider as well.   To learn more about what you can do with MyChart, go to [email or web address].    Your next appointment:  As Needed   

## 2019-10-27 NOTE — Progress Notes (Signed)
Cardiology Office Note:    Date:  10/29/2019   ID:  Shane Munoz, DOB 1980-08-27, MRN [id number]  PCP:  [name], NP  [facility] Cardiologist:  [name], MD  [facility] Electrophysiologist:  None   Referring MD: [name], NP   Chief Complaint  Patient presents with  . Follow-up    seen for Dr. Jacques Munoz    History of Present Illness:    Shane Munoz is a 39 y.o. adult with a hx of PTSD, hyperlipidemia, dissociative identity disorder, nonbinary gender who recently went to the hospital in June 2021 with chest pain.  When he presented to the hospital, he was also having significant nasal congestion and had trembling and shortness of breath.  EKG showed sinus rhythm with no acute changes, however patient developed rapid heart rate with ST depression in the inferolateral leads.  He had mildly elevated troponin likely related to tachycardia.  Echocardiogram obtained on 08/21/2019 showed EF 60 to 65%, no significant valve issue.  Coronary CT showed Coronary calcium score of 0, dilated ascending aorta, otherwise no evidence of coronary artery disease.  Extracardiac portion of CT image demonstrated esophageal air-fluid level suggests dysmotility or GERD.  Patient presents today for cardiology office visit.  He denies any chest pain however continues to deal with a lot of GI issues.  Otherwise he is doing well from cardiac perspective without significant shortness of breath.  He mentions he has managed to lose 25 pounds this year.  On exam tachycardia has resolved and the blood pressure is well controlled.  He can follow-up with cardiology service on as-needed basis.  I did recommend he discuss with his primary care provider to do a regular CT of the chest in June 2022 to make sure the dilated aortic root has not increase in size.    Past Medical History:  Diagnosis Date  . Alcohol addiction (HCC)    and drug addiction  . Allergy   . Dissociative identity  disorder (HCC)   . PTSD (post-traumatic stress disorder)     Past Surgical History:  Procedure Laterality Date  . MIDDLE EAR SURGERY     tube inside    Current Medications: Current Meds  Medication Sig  . anastrozole (ARIMIDEX) 1 MG tablet Take 1 mg by mouth daily.  . clomiPHENE (CLOMID) 50 MG tablet Take 50 mg by mouth daily.  Marland Kitchen loratadine (CLARITIN) 10 MG tablet Take 10 mg by mouth daily.  . pantoprazole (PROTONIX) 20 MG tablet Take 1 tablet (20 mg total) by mouth daily.  Marland Kitchen venlafaxine XR (EFFEXOR-XR) 37.5 MG 24 hr capsule Take 1 capsule (37.5 mg total) by mouth daily with breakfast.     Allergies:   Metoprolol   Social History   Socioeconomic History  . Marital status: Married    Spouse name: Not on file  . Number of children: Not on file  . Years of education: Not on file  . Highest education level: Not on file  Occupational History  . Not on file  Tobacco Use  . Smoking status: Never Smoker  . Smokeless tobacco: Never Used  Vaping Use  . Vaping Use: Never used  Substance and Sexual Activity  . Alcohol use: Yes    Comment: social  . Drug use: No  . Sexual activity: Yes  Other Topics Concern  . Not on file  Social History Narrative  . Not on file   Social Determinants of Health   Financial Resource Strain:   .  Difficulty of Paying Living Expenses: Not on file  Food Insecurity:   . Worried About Programme researcher, broadcasting/film/video in the Last Year: Not on file  . Ran Out of Food in the Last Year: Not on file  Transportation Needs:   . Lack of Transportation (Medical): Not on file  . Lack of Transportation (Non-Medical): Not on file  Physical Activity:   . Days of Exercise per Week: Not on file  . Minutes of Exercise per Session: Not on file  Stress:   . Feeling of Stress : Not on file  Social Connections:   . Frequency of Communication with Friends and Family: Not on file  . Frequency of Social Gatherings with Friends and Family: Not on file  . Attends Religious  Services: Not on file  . Active Member of Clubs or Organizations: Not on file  . Attends Banker Meetings: Not on file  . Marital Status: Not on file     Family History: The patient's Family history is unknown by patient.  ROS:   Please see the history of present illness.     All other systems reviewed and are negative.  EKGs/Labs/Other Studies Reviewed:    The following studies were reviewed today:  Coronary CT 08/21/2019 IMPRESSION: 1. No evidence of CAD, CADRADS = 0.  2. Coronary calcium score of 0.  3. Normal coronary origin with right dominance.  4. 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   EKG:  EKG is not ordered today.   Recent Labs: 08/21/2019: ALT 24; BUN 15; Creatinine, Ser 1.01; Hemoglobin 16.7; Magnesium 2.2; Platelets 258; Potassium 4.4; Sodium 139; TSH 1.138  Recent Lipid Panel    Component Value Date/Time   CHOL 154 09/29/2019 0807   TRIG 116.0 09/29/2019 0807   HDL 35.40 (L) 09/29/2019 0807   CHOLHDL 4 09/29/2019 0807   VLDL 23.2 09/29/2019 0807   LDLCALC 95 09/29/2019 0807   LDLCALC 119 (H) 09/30/2018 0904    Physical Exam:    VS:  BP 128/70   Pulse 64   Ht 5\' 10"  (1.778 m)   Wt 217 lb (98.4 kg)   BMI 31.14 kg/m     Wt Readings from Last 3 Encounters:  10/27/19 217 lb (98.4 kg)  09/28/19 (!) 220 lb 3.2 oz (99.9 kg)  09/06/19 224 lb (101.6 kg)     GEN:  Well nourished, well developed in no acute distress HEENT: Normal NECK: No JVD; No carotid bruits LYMPHATICS: No lymphadenopathy CARDIAC: RRR, no murmurs, rubs, gallops RESPIRATORY:  Clear to auscultation without rales, wheezing or rhonchi  ABDOMEN: Soft, non-tender, non-distended MUSCULOSKELETAL:  No edema; No deformity  SKIN: Warm and dry NEUROLOGIC:  Alert and oriented x 3 PSYCHIATRIC:  Normal affect   ASSESSMENT:    1. Chest pain of uncertain etiology   2. Enlarged thoracic aorta (HCC)    PLAN:    In order of  problems listed above:  1. Chest pain: Recent coronary CT shows no significant coronary artery disease.  Chest pain is unlikely to be cardiac in origin.  Follow-up as needed  2. Enlarged thoracic aorta: Recommended 1 year CT scan by PCP.  Result has been discussed with the patient who will follow up with his primary care provider.   Medication Adjustments/Labs and Tests Ordered: Current medicines are reviewed at length with the patient today.  Concerns regarding medicines are outlined above.  No orders of the defined types were  placed in this encounter.  No orders of the defined types were placed in this encounter.   Patient Instructions  Medication Instructions:  Continue same medications   Lab Work: None ordered   Testing/Procedures: None ordered   Follow-Up: At [facility], you and your health needs are our priority.  As part of our continuing mission to provide you with exceptional heart care, we have created designated Provider Care Teams.  These Care Teams include your primary Cardiologist (physician) and Advanced Practice Providers (APPs -  Physician Assistants and Nurse Practitioners) who all work together to provide you with the care you need, when you need it.  We recommend signing up for the patient portal called "MyChart".  Sign up information is provided on this After Visit Summary.  MyChart is used to connect with patients for Virtual Visits (Telemedicine).  Patients are able to view lab/test results, encounter notes, upcoming appointments, etc.  Non-urgent messages can be sent to your provider as well.   To learn more about what you can do with MyChart, go to [email or web address].    Your next appointment:  As Needed      Signed, Azalee Course, [place]  10/29/2019 10:48 PM    [facility]

## 2019-10-29 ENCOUNTER — Encounter: Payer: Self-pay | Admitting: Physician Assistant

## 2019-10-29 DIAGNOSIS — I7789 Other specified disorders of arteries and arterioles: Secondary | ICD-10-CM | POA: Insufficient documentation

## 2019-10-29 HISTORY — DX: Other specified disorders of arteries and arterioles: I77.89

## 2019-10-31 ENCOUNTER — Other Ambulatory Visit: Payer: Self-pay

## 2019-10-31 ENCOUNTER — Other Ambulatory Visit: Payer: Self-pay | Admitting: Nurse Practitioner

## 2019-10-31 DIAGNOSIS — K2100 Gastro-esophageal reflux disease with esophagitis, without bleeding: Secondary | ICD-10-CM

## 2019-11-01 ENCOUNTER — Ambulatory Visit (INDEPENDENT_AMBULATORY_CARE_PROVIDER_SITE_OTHER): Payer: 59 | Admitting: Nurse Practitioner

## 2019-11-01 ENCOUNTER — Encounter: Payer: Self-pay | Admitting: Nurse Practitioner

## 2019-11-01 VITALS — BP 114/80 | HR 66 | Temp 97.4°F | Ht 70.0 in | Wt 214.8 lb

## 2019-11-01 DIAGNOSIS — Z8659 Personal history of other mental and behavioral disorders: Secondary | ICD-10-CM

## 2019-11-01 DIAGNOSIS — F411 Generalized anxiety disorder: Secondary | ICD-10-CM

## 2019-11-01 MED ORDER — VENLAFAXINE HCL ER 37.5 MG PO CP24: 37.5000 mg | ORAL_CAPSULE | Freq: Every day | ORAL | 3 refills | Status: DC

## 2019-11-01 NOTE — Telephone Encounter (Signed)
Medcation refill request.  Request declined. Recent dosage change.   OV scheduled 11/01/19

## 2019-11-01 NOTE — Progress Notes (Signed)
Subjective:  Patient ID: Shane Munoz, adult    DOB: 1980/07/20  Age: 39 y.o. MRN: [id number]  CC: Follow-up (4 week f/u on anxiety)  HPI  GAD (generalized anxiety disorder) Improved mood with effexor and CBT with therapist. Maintain current medication F/up in 110months  Depression screen [facility] 2/9 11/01/2019 08/24/2019 08/30/2018  Decreased Interest 0 0 0  Down, Depressed, Hopeless 0 1 0  PHQ - 2 Score 0 1 0  Altered sleeping 0 3 -  Tired, decreased energy 0 1 -  Change in appetite 0 1 -  Feeling bad or failure about yourself  0 2 -  Trouble concentrating 0 0 -  Moving slowly or fidgety/restless 0 0 -  Suicidal thoughts 0 0 -  PHQ-9 Score 0 8 -  Difficult doing work/chores Not difficult at all Very difficult -   GAD 7 : Generalized Anxiety Score 11/01/2019 08/24/2019  Nervous, Anxious, on Edge 1 3  Control/stop worrying 0 3  Worry too much - different things 0 3  Trouble relaxing 0 3  Restless 0 1  Easily annoyed or irritable 1 1  Afraid - awful might happen 0 2  Total GAD 7 Score 2 16  Anxiety Difficulty Not difficult at all Very difficult   Reviewed past Medical, Social and Family history today.  Outpatient Medications Prior to Visit  Medication Sig Dispense Refill  . anastrozole (ARIMIDEX) 1 MG tablet Take 1 mg by mouth daily.    . clomiPHENE (CLOMID) 50 MG tablet Take 50 mg by mouth daily.    Marland Kitchen loratadine (CLARITIN) 10 MG tablet Take 10 mg by mouth daily.    . pantoprazole (PROTONIX) 20 MG tablet Take 1 tablet (20 mg total) by mouth daily. 30 tablet 0  . venlafaxine XR (EFFEXOR-XR) 37.5 MG 24 hr capsule Take 1 capsule (37.5 mg total) by mouth daily with breakfast. 30 capsule 5   No facility-administered medications prior to visit.    ROS See HPI  Objective:  BP 114/80 (BP Location: Left Arm, Patient Position: Sitting, Cuff Size: Normal)   Pulse 66   Temp (!) 97.4 F (36.3 C) (Temporal)   Ht 5\' 10"  (1.778 m)   Wt 214 lb 12.8 oz (97.4 kg)   SpO2 99%   BMI  30.82 kg/m   Physical Exam Vitals reviewed.  Constitutional:      Appearance: Shane Munoz is obese.  Neurological:     Mental Status: Shane Munoz is alert.  Psychiatric:        Attention and Perception: Attention normal.        Mood and Affect: Mood normal.        Speech: Speech normal.        Behavior: Behavior is cooperative.        Thought Content: Thought content normal.        Cognition and Memory: Cognition normal.        Judgment: Judgment normal.    Assessment & Plan:  This visit occurred during the SARS-CoV-2 public health emergency.  Safety protocols were in place, including screening questions prior to the visit, additional usage of staff PPE, and extensive cleaning of exam room while observing appropriate contact time as indicated for disinfecting solutions.   Shane Munoz was seen today for follow-up.  Diagnoses and all orders for this visit:  GAD (generalized anxiety disorder) -     venlafaxine XR (EFFEXOR-XR) 37.5 MG 24 hr capsule; Take 1 capsule (37.5 mg total) by mouth daily with breakfast.  History of posttraumatic stress disorder (PTSD) -     venlafaxine XR (EFFEXOR-XR) 37.5 MG 24 hr capsule; Take 1 capsule (37.5 mg total) by mouth daily with breakfast.   Problem List Items Addressed This Visit      Other   GAD (generalized anxiety disorder) - Primary    Improved mood with effexor and CBT with therapist. Maintain current medication F/up in 25months      Relevant Medications   venlafaxine XR (EFFEXOR-XR) 37.5 MG 24 hr capsule   History of posttraumatic stress disorder (PTSD)   Relevant Medications   venlafaxine XR (EFFEXOR-XR) 37.5 MG 24 hr capsule      Follow-up: Return in about 3 months (around 02/01/2020) for anxiety (video appt).  Alysia Penna, NP

## 2019-11-01 NOTE — Patient Instructions (Signed)
No hiatal hernia per CXR and CT chest You are due to CPE  08/2020. Continue current medications.

## 2019-11-01 NOTE — Assessment & Plan Note (Signed)
Improved mood with effexor and CBT with therapist. Maintain current medication F/up in 37months

## 2019-11-16 ENCOUNTER — Encounter (INDEPENDENT_AMBULATORY_CARE_PROVIDER_SITE_OTHER): Payer: Self-pay

## 2019-11-16 ENCOUNTER — Ambulatory Visit: Payer: 59 | Admitting: Nurse Practitioner

## 2019-11-16 ENCOUNTER — Encounter: Payer: Self-pay | Admitting: Gastroenterology

## 2019-11-16 ENCOUNTER — Encounter: Payer: Self-pay | Admitting: Nurse Practitioner

## 2019-11-16 VITALS — BP 110/68 | HR 68 | Ht 70.0 in | Wt 214.0 lb

## 2019-11-16 DIAGNOSIS — K219 Gastro-esophageal reflux disease without esophagitis: Secondary | ICD-10-CM | POA: Diagnosis not present

## 2019-11-16 NOTE — Progress Notes (Signed)
____________________________________________________________  Attending physician addendum:  Thank you for sending this case to me. I have reviewed the entire note, and the outlined plan seems appropriate.  I also reviewed the CTA chest images.  Significance of AFL in esophagus unclear, but may indicate reflux.  No esophageal dilatation, but must consider achalasia.  EGD certainly warranted.  Amada Jupiter, MD  ____________________________________________________________

## 2019-11-16 NOTE — Patient Instructions (Signed)
If you are age 41 or older, your body mass index should be between 23-30. Your Body mass index is 30.71 kg/m. If this is out of the aforementioned range listed, please consider follow up with your Primary Care Provider.  If you are age 32 or younger, your body mass index should be between 19-25. Your Body mass index is 30.71 kg/m. If this is out of the aformentioned range listed, please consider follow up with your Primary Care Provider.   You have been scheduled for an endoscopy. Please follow written instructions given to you at your visit today. If you use inhalers (even only as needed), please bring them with you on the day of your procedure.  Due to recent changes in healthcare laws, you may see the results of your imaging and laboratory studies on MyChart before your provider has had a chance to review them.  We understand that in some cases there may be results that are confusing or concerning to you. Not all laboratory results come back in the same time frame and the provider may be waiting for multiple results in order to interpret others.  Please give [place] 48 hours in order for your provider to thoroughly review all the results before contacting the office for clarification of your results.

## 2019-11-16 NOTE — Progress Notes (Signed)
ASSESSMENT AND PLAN    # GERD manifested as heartburn / globus sensation.  --Chest CT scan remarkable for air fluid levels in esophagus suggesting dysmotility or GERD --Only 50% better on PPI and anti-reflux measures --Continue anti-reflux measures and Protonix before breakfast.  --For further evaluation of persistent GERD symptoms and chest CT scan findings patient will be scheduled for EGD. The risks and benefits of EGD were discussed and the patient agrees to proceed.   # Dissociative identity disorder   HISTORY OF PRESENT ILLNESS     Primary Gastroenterologist : new Estate agent) Chief Complaint : GERD  Shane Munoz is a 39 y.o. nonbinary adult with PMH / PSH significant for,  but not necessarily limited to: Generalized anxiety disorder, PTSD, dissociative identity disorder, hyperlipidemia and GERD  Patient referred by PCP for evaluation of GERD. He complains of throat burning with spicy and acidic PO. Also complains of globus sensation. Symptoms started 3 months ago following hospital admission for chest pain / sob / bilateral upper and lower paresthesia. . CMP and CBC unrevealing. CXR unrevealing.  Chest CT scan remarkable for air fluid levels in esophagus suggesting dysmotility or GERD. Coronary CTA and echo negative. On PPI since hospital discharge and says only about 50 % better but still has frequent globus. Denies dysphagia. Doesn't consume caffeine anymore. He has been exercising and has lost ~ 25 pounds recently. Initially with onset on GI symptoms he lost 10 pounds within the first week. Now having more gradual weight loss which is intentional. Stops PO intake 3 hours prior to lying down. Eats healthy.   Data Reviewed: ED 08/20/19 EKG - sinus rhythm borderline prolonged PR interval, probable left atrial enlargement CMP, cbc unrevealing.  Troponin elevated 50 >> 44 >> 19    Past Medical History:  Diagnosis Date  . Alcohol addiction (HCC)    and drug addiction  .  Allergy   . Dissociative identity disorder (HCC)   . PTSD (post-traumatic stress disorder)      Past Surgical History:  Procedure Laterality Date  . MIDDLE EAR SURGERY     tube inside   Family History  Family history unknown: Yes   Social History   Tobacco Use  . Smoking status: Never Smoker  . Smokeless tobacco: Never Used  Vaping Use  . Vaping Use: Never used  Substance Use Topics  . Alcohol use: Yes    Comment: social  . Drug use: No   Current Outpatient Medications  Medication Sig Dispense Refill  . anastrozole (ARIMIDEX) 1 MG tablet Take 1 mg by mouth daily.    . clomiPHENE (CLOMID) 50 MG tablet Take 50 mg by mouth daily.    Marland Kitchen loratadine (CLARITIN) 10 MG tablet Take 10 mg by mouth daily.    . pantoprazole (PROTONIX) 20 MG tablet Take 1 tablet (20 mg total) by mouth daily. 30 tablet 0  . venlafaxine XR (EFFEXOR-XR) 37.5 MG 24 hr capsule Take 1 capsule (37.5 mg total) by mouth daily with breakfast. 90 capsule 3   No current facility-administered medications for this visit.   Allergies  Allergen Reactions  . Metoprolol Palpitations     Review of Systems:  All systems reviewed and negative except where noted in HPI.   PHYSICAL EXAM :    Wt Readings from Last 3 Encounters:  11/01/19 214 lb 12.8 oz (97.4 kg)  10/27/19 217 lb (98.4 kg)  09/28/19 (!) 220 lb 3.2 oz (99.9 kg)    There were no  vitals taken for this visit. Constitutional:  Pleasant male in no acute distress. Psychiatric: Normal mood and affect. Behavior is normal. EENT: Pupils normal.  Conjunctivae are normal. No scleral icterus. Neck supple.  Cardiovascular: Normal rate, regular rhythm. No edema Pulmonary/chest: Effort normal and breath sounds normal. No wheezing, rales or rhonchi. Abdominal: Soft, nondistended, nontender. Bowel sounds active throughout. There are no masses palpable. No hepatomegaly. Neurological: Alert and oriented to person place and time. Skin: Skin is warm and dry. No  rashes noted.  Willette Cluster, NP  11/16/2019, 8:41 AM  Cc:  Referring Provider Nche, Bonna Gains, NP

## 2019-11-20 ENCOUNTER — Other Ambulatory Visit: Payer: Self-pay | Admitting: Nurse Practitioner

## 2019-11-20 ENCOUNTER — Encounter: Payer: Self-pay | Admitting: Gastroenterology

## 2019-11-20 DIAGNOSIS — K2100 Gastro-esophageal reflux disease with esophagitis, without bleeding: Secondary | ICD-10-CM

## 2019-11-23 ENCOUNTER — Encounter: Payer: Self-pay | Admitting: Gastroenterology

## 2019-11-23 ENCOUNTER — Ambulatory Visit (AMBULATORY_SURGERY_CENTER): Payer: 59 | Admitting: Gastroenterology

## 2019-11-23 ENCOUNTER — Other Ambulatory Visit: Payer: Self-pay

## 2019-11-23 VITALS — BP 130/79 | HR 56 | Temp 97.5°F | Resp 18 | Ht 70.0 in | Wt 214.0 lb

## 2019-11-23 DIAGNOSIS — R198 Other specified symptoms and signs involving the digestive system and abdomen: Secondary | ICD-10-CM

## 2019-11-23 DIAGNOSIS — K219 Gastro-esophageal reflux disease without esophagitis: Secondary | ICD-10-CM | POA: Diagnosis not present

## 2019-11-23 DIAGNOSIS — R0989 Other specified symptoms and signs involving the circulatory and respiratory systems: Secondary | ICD-10-CM

## 2019-11-23 MED ORDER — SODIUM CHLORIDE 0.9 % IV SOLN: 500.0000 mL | Freq: Once | INTRAVENOUS | Status: DC

## 2019-11-23 NOTE — Op Note (Signed)
[facility] Patient Name: Shane Munoz Procedure Date: 11/23/2019 3:28 PM MRN: [id number] Endoscopist: Sherilyn Cooter L. Myrtie Neither , MD Age: 39 Referring MD:  Date of Birth: 1980/05/08 Gender: Male Account #: [id number] Procedure:                Upper GI endoscopy Indications:              Esophageal reflux symptoms that recur despite                            appropriate therapy, Abnormal CT of the GI tract                            (esophageal air-fluid level), Globus sensation Medicines:                Monitored Anesthesia Care Procedure:                Pre-Anesthesia Assessment:                           - Prior to the procedure, a History and Physical                            was performed, and patient medications and                            allergies were reviewed. The patient's tolerance of                            previous anesthesia was also reviewed. The risks                            and benefits of the procedure and the sedation                            options and risks were discussed with the patient.                            All questions were answered, and informed consent                            was obtained. Prior Anticoagulants: The patient has                            taken no previous anticoagulant or antiplatelet                            agents. ASA Grade Assessment: II - A patient with                            mild systemic disease. After reviewing the risks                            and benefits, the patient was deemed in  satisfactory condition to undergo the procedure.                           After obtaining informed consent, the endoscope was                            passed under direct vision. Throughout the                            procedure, the patient's blood pressure, pulse, and                            oxygen saturations were monitored continuously. The                            Endoscope was  introduced through the mouth, and                            advanced to the second part of duodenum. The upper                            GI endoscopy was accomplished without difficulty.                            The patient tolerated the procedure well. Scope In: Scope Out: Findings:                 The esophagus was normal.                           There is no endoscopic evidence of esophagitis,                            hiatal hernia or stricture in the entire esophagus.                            No resistance passing scope through EG junction. No                            esophageal dilatation.                           The stomach was normal.                           The cardia and gastric fundus were normal on                            retroflexion. (Hill Grade 1)                           The examined duodenum was normal. Complications:            No immediate complications. Estimated Blood Loss:     Estimated blood loss: none. Impression:               -  Normal esophagus.                           - Normal stomach.                           - Normal examined duodenum.                           - No specimens collected. Recommendation:           - Patient has a contact number available for                            emergencies. The signs and symptoms of potential                            delayed complications were discussed with the                            patient. Return to normal activities tomorrow.                            Written discharge instructions were provided to the                            patient.                           - Resume previous diet.                           - Continue present medications.                           - Follow an antireflux regimen indefinitely.                            Continued attention to dietary measures, weight                            loss, elevating head of bed for sleep will likely                             lead to control of GERD symptoms and allow gradual                            wean off PPI therapy.                           - Return to my office as needed. Soledad Budreau L. Myrtie Neither, MD 11/23/2019 4:12:33 PM This report has been signed electronically.

## 2019-11-23 NOTE — Progress Notes (Signed)
To PACU, VSS. Report to [email or web address] 

## 2019-11-23 NOTE — Progress Notes (Signed)
Vitals-JK  History reviewed. 

## 2019-11-23 NOTE — Patient Instructions (Signed)
Follow anti reflux regimen   YOU HAD AN ENDOSCOPIC PROCEDURE TODAY AT THE [facility]:   Refer to the procedure report that was given to you for any specific questions about what was found during the examination.  If the procedure report does not answer your questions, please call your gastroenterologist to clarify.  If you requested that your care partner not be given the details of your procedure findings, then the procedure report has been included in a sealed envelope for you to review at your convenience later.  YOU SHOULD EXPECT: Some feelings of bloating in the abdomen. Passage of more gas than usual.  Walking can help get rid of the air that was put into your GI tract during the procedure and reduce the bloating. If you had a lower endoscopy (such as a colonoscopy or flexible sigmoidoscopy) you may notice spotting of blood in your stool or on the toilet paper. If you underwent a bowel prep for your procedure, you may not have a normal bowel movement for a few days.  Please Note:  You might notice some irritation and congestion in your nose or some drainage.  This is from the oxygen used during your procedure.  There is no need for concern and it should clear up in a day or so.  SYMPTOMS TO REPORT IMMEDIATELY:    Following upper endoscopy (EGD)  Vomiting of blood or coffee ground material  New chest pain or pain under the shoulder blades  Painful or persistently difficult swallowing  New shortness of breath  Fever of 100F or higher  Black, tarry-looking stools  For urgent or emergent issues, a gastroenterologist can be reached at any hour by calling [phone]. Do not use MyChart messaging for urgent concerns.    DIET:  We do recommend a small meal at first, but then you may proceed to your regular diet.  Drink plenty of fluids but you should avoid alcoholic beverages for 24 hours.  ACTIVITY:  You should plan to take it easy for the rest of today and you should NOT  DRIVE or use heavy machinery until tomorrow (because of the sedation medicines used during the test).    FOLLOW UP: Our staff will call the number listed on your records 48-72 hours following your procedure to check on you and address any questions or concerns that you may have regarding the information given to you following your procedure. If we do not reach you, we will leave a message.  We will attempt to reach you two times.  During this call, we will ask if you have developed any symptoms of COVID 19. If you develop any symptoms (ie: fever, flu-like symptoms, shortness of breath, cough etc.) before then, please call [phone].  If you test positive for Covid 19 in the 2 weeks post procedure, please call and report this information to [place].    If any biopsies were taken you will be contacted by phone or by letter within the next 1-3 weeks.  Please call us at [phone] if you have not heard about the biopsies in 3 weeks.    SIGNATURES/CONFIDENTIALITY: You and/or your care partner have signed paperwork which will be entered into your electronic medical record.  These signatures attest to the fact that that the information above on your After Visit Summary has been reviewed and is understood.  Full responsibility of the confidentiality of this discharge information lies with you and/or your care-partner.

## 2019-11-26 ENCOUNTER — Encounter (INDEPENDENT_AMBULATORY_CARE_PROVIDER_SITE_OTHER): Payer: Self-pay

## 2019-11-27 ENCOUNTER — Telehealth: Payer: Self-pay | Admitting: Gastroenterology

## 2019-11-27 ENCOUNTER — Telehealth: Payer: Self-pay | Admitting: *Deleted

## 2019-11-27 NOTE — Telephone Encounter (Signed)
Thank you for letting me know.  - HD 

## 2019-11-27 NOTE — Telephone Encounter (Signed)
  Follow up Call-  Call back number 11/23/2019  Post procedure Call Back phone  # [phone]  Permission to leave phone message Yes  Some recent data might be hidden     Patient questions:  Do you have a fever, pain , or abdominal swelling? Yes.  -fever, see below Pain Score  0 *  Have you tolerated food without any problems? Yes.    Have you been able to return to your normal activities? Yes.    Do you have any questions about your discharge instructions: Diet   No. Medications  No. Follow up visit  No.  Do you have questions or concerns about your Care? No.  Actions: * If pain score is 4 or above: No action needed, pain <4.  1. Have you developed a fever since your procedure? Yes- fever over the weekend, since procedure. Highest fever of 101F. Also c/o body aches, chills and a persistent headache.   2.   Have you had an respiratory symptoms (SOB or cough) since your procedure? no  3.   Have you tested positive for COVID 19 since your procedure no- going for a Covid test today 11/27/19. RN instructed pt to call our office if test comes back positive, to make [place] aware; he states he will.   4.   Have you had any family members/close contacts diagnosed with the COVID 19 since your procedure?  no   If yes to any of these questions please route to Laverna Peace, RN and Karlton Lemon, RN

## 2019-11-27 NOTE — Telephone Encounter (Signed)
Patient had EGD 9/16 which was unremarkable with no biopsies or dilation.  He developed fever, myalgia and fatigue Saturday night and scheduled for Covid test on Monday. He also has follow up with PMD. Advised him to stay hydrated, monitor his symptoms, call PMD with any changes in symptoms.

## 2019-11-29 ENCOUNTER — Encounter: Payer: Self-pay | Admitting: Nurse Practitioner

## 2019-11-29 ENCOUNTER — Telehealth (INDEPENDENT_AMBULATORY_CARE_PROVIDER_SITE_OTHER): Payer: 59 | Admitting: Nurse Practitioner

## 2019-11-29 VITALS — BP 120/79 | HR 59 | Temp 97.8°F | Ht 70.0 in | Wt 213.3 lb

## 2019-11-29 DIAGNOSIS — R5381 Other malaise: Secondary | ICD-10-CM | POA: Diagnosis not present

## 2019-11-29 DIAGNOSIS — B001 Herpesviral vesicular dermatitis: Secondary | ICD-10-CM | POA: Diagnosis not present

## 2019-11-29 DIAGNOSIS — R5383 Other fatigue: Secondary | ICD-10-CM | POA: Diagnosis not present

## 2019-11-29 MED ORDER — VALACYCLOVIR HCL 1 G PO TABS: 2000.0000 mg | ORAL_TABLET | Freq: Two times a day (BID) | ORAL | 0 refills | Status: DC

## 2019-11-29 NOTE — Patient Instructions (Addendum)
Send COVID results once obtained Maintain adequate oral hydration Use tylenol prn for headache and malaise/fever. Will need to complete CBC, BMP and urinalylsis if negative COVID test.

## 2019-11-29 NOTE — Progress Notes (Signed)
Virtual Visit via Video Note  I connected with@ on 11/29/19 at  2:00 PM EDT by a video enabled telemedicine application and verified that I am speaking with the correct person using two identifiers.  Location: Patient:Home Provider: Office Participants: patient and provider  I discussed the limitations of evaluation and management by telemedicine and the availability of in person appointments. I also discussed with the patient that there may be a patient responsible charge related to this service. The patient expressed understanding and agreed to proceed.  CC:Pt c/o blood in his urine, chills, night sweats, and headaches x3-4 days. Pt states he though it could be due to possible chigger bites from a previous trip he took but now he is unsure what it is. Pt states he also has body aches and fatigue.   History of Present Illness: Fever  This is a new problem. The current episode started in the past 7 days. The problem occurs intermittently. The problem has been resolved. The maximum temperature noted was 101 to 101.9 F. The temperature was taken using a tympanic thermometer. Associated symptoms include headaches and muscle aches. Pertinent negatives include no abdominal pain, chest pain, congestion, coughing, diarrhea, ear pain, nausea, rash, sleepiness, sore throat, urinary pain, vomiting or wheezing. Shane Munoz has tried acetaminophen for the symptoms. The treatment provided significant relief.  Risk factors: no contaminated food, no contaminated water, no immunosuppression, no recent travel and no sick contacts   had chills on 11/21/19, resolved spontaneous Had upper endoscopy on 11/23/2019, normal exam Had Malaise, headache and fever of 101 on 11/24/19, resolved with tylenol. Had pink tint at end of urination on 11/28/2019, spontaneously resolved, no indwelling catheter, no ABD pain, no dysuria or urinary frequency/urgency, no back pain or cervical lymphadenopathy or neck pain Developed upper  lip sore yesterday COVID test completed 11/28/2019   Observations/Objective: Physical Exam Vitals and nursing note reviewed.  Constitutional:      General: Shane Munoz is not in acute distress. HENT:     Mouth/Throat:   Pulmonary:     Effort: Pulmonary effort is normal.  Neurological:     Mental Status: Shane Munoz is alert and oriented to person, place, and time.     Assessment and Plan: Shane Munoz was seen today for acute visit.  Diagnoses and all orders for this visit:  Malaise and fatigue  Herpes labialis -     valACYclovir (VALTREX) 1000 MG tablet; Take 2 tablets (2,000 mg total) by mouth 2 (two) times daily for 1 day.   Follow Up Instructions: Send COVID results once obtained Maintain adequate oral hydration Use tylenol prn for headache and malaise/fever. Will need to complete CBC, BMP and urinalysis if negative COVID test.   I discussed the assessment and treatment plan with the patient. The patient was provided an opportunity to ask questions and all were answered. The patient agreed with the plan and demonstrated an understanding of the instructions.   The patient was advised to call back or seek an in-person evaluation if the symptoms worsen or if the condition fails to improve as anticipated.  Alysia Penna, NP

## 2019-11-30 ENCOUNTER — Other Ambulatory Visit: Payer: Self-pay

## 2019-11-30 DIAGNOSIS — R5381 Other malaise: Secondary | ICD-10-CM

## 2019-12-01 ENCOUNTER — Encounter: Payer: Self-pay | Admitting: Nurse Practitioner

## 2019-12-01 ENCOUNTER — Other Ambulatory Visit (INDEPENDENT_AMBULATORY_CARE_PROVIDER_SITE_OTHER): Payer: 59

## 2019-12-01 DIAGNOSIS — R5381 Other malaise: Secondary | ICD-10-CM | POA: Diagnosis not present

## 2019-12-01 DIAGNOSIS — N3001 Acute cystitis with hematuria: Secondary | ICD-10-CM

## 2019-12-01 DIAGNOSIS — R5383 Other fatigue: Secondary | ICD-10-CM | POA: Diagnosis not present

## 2019-12-01 LAB — CBC
HCT: 45.6 % (ref 39.0–52.0)
Hemoglobin: 15.3 g/dL (ref 13.0–17.0)
MCHC: 33.5 g/dL (ref 30.0–36.0)
MCV: 85.9 fl (ref 78.0–100.0)
Platelets: 275 10*3/uL (ref 150.0–400.0)
RBC: 5.31 Mil/uL (ref 4.22–5.81)
RDW: 12.6 % (ref 11.5–15.5)
WBC: 6.8 10*3/uL (ref 4.0–10.5)

## 2019-12-01 LAB — BASIC METABOLIC PANEL
BUN: 13 mg/dL (ref 6–23)
CO2: 32 mEq/L (ref 19–32)
Calcium: 9.2 mg/dL (ref 8.4–10.5)
Chloride: 101 mEq/L (ref 96–112)
Creatinine, Ser: 1.08 mg/dL (ref 0.40–1.50)
GFR: 75.29 mL/min (ref >60.00)
Glucose, Bld: 95 mg/dL (ref 70–99)
Potassium: 4.5 mEq/L (ref 3.5–5.1)
Sodium: 141 mEq/L (ref 135–145)

## 2019-12-01 LAB — POCT URINALYSIS DIPSTICK
Bilirubin, UA: NEGATIVE
Glucose, UA: NEGATIVE
Ketones, UA: NEGATIVE
Nitrite, UA: NEGATIVE
Protein, UA: POSITIVE — AB
Spec Grav, UA: 1.02 (ref 1.010–1.025)
Urobilinogen, UA: 0.2 E.U./dL
pH, UA: 6.5 (ref 5.0–8.0)

## 2019-12-01 NOTE — Addendum Note (Signed)
Addended by: Varney Biles on: 12/01/2019 11:21 AM   Modules accepted: Orders

## 2019-12-01 NOTE — Addendum Note (Signed)
Addended by: Varney Biles on: 12/01/2019 08:18 AM   Modules accepted: Orders

## 2019-12-03 ENCOUNTER — Encounter: Payer: Self-pay | Admitting: Nurse Practitioner

## 2019-12-03 LAB — URINE CULTURE
MICRO NUMBER:: 10993001
SPECIMEN QUALITY:: ADEQUATE

## 2019-12-04 ENCOUNTER — Other Ambulatory Visit: Payer: Self-pay

## 2019-12-04 MED ORDER — SULFAMETHOXAZOLE-TRIMETHOPRIM 800-160 MG PO TABS: 1.0000 | ORAL_TABLET | Freq: Two times a day (BID) | ORAL | 0 refills | Status: DC

## 2019-12-04 NOTE — Addendum Note (Signed)
Addended by: Alysia Penna L on: 12/04/2019 08:16 AM   Modules accepted: Orders

## 2019-12-05 ENCOUNTER — Encounter: Payer: Self-pay | Admitting: Nurse Practitioner

## 2019-12-05 ENCOUNTER — Ambulatory Visit (INDEPENDENT_AMBULATORY_CARE_PROVIDER_SITE_OTHER): Payer: 59

## 2019-12-05 DIAGNOSIS — Z23 Encounter for immunization: Secondary | ICD-10-CM | POA: Diagnosis not present

## 2019-12-05 NOTE — Progress Notes (Signed)
Pt came in for his flu shot, pt was given the injection in his left deltoid and tolerated injection well. Pt was given a information sheet and told to stay in office for atleast 15-20 min.

## 2019-12-20 DIAGNOSIS — K219 Gastro-esophageal reflux disease without esophagitis: Secondary | ICD-10-CM

## 2019-12-20 DIAGNOSIS — J310 Chronic rhinitis: Secondary | ICD-10-CM | POA: Insufficient documentation

## 2019-12-20 DIAGNOSIS — J309 Allergic rhinitis, unspecified: Secondary | ICD-10-CM | POA: Insufficient documentation

## 2019-12-20 HISTORY — DX: Gastro-esophageal reflux disease without esophagitis: K21.9

## 2020-01-30 ENCOUNTER — Other Ambulatory Visit: Payer: Self-pay | Admitting: Nurse Practitioner

## 2020-01-30 DIAGNOSIS — B001 Herpesviral vesicular dermatitis: Secondary | ICD-10-CM

## 2020-02-05 ENCOUNTER — Telehealth (INDEPENDENT_AMBULATORY_CARE_PROVIDER_SITE_OTHER): Payer: 59 | Admitting: Nurse Practitioner

## 2020-02-05 ENCOUNTER — Encounter: Payer: Self-pay | Admitting: Nurse Practitioner

## 2020-02-05 VITALS — BP 130/90 | Temp 98.1°F | Ht 70.0 in | Wt 218.0 lb

## 2020-02-05 DIAGNOSIS — K219 Gastro-esophageal reflux disease without esophagitis: Secondary | ICD-10-CM

## 2020-02-05 DIAGNOSIS — F411 Generalized anxiety disorder: Secondary | ICD-10-CM | POA: Diagnosis not present

## 2020-02-05 NOTE — Progress Notes (Signed)
Virtual Visit via Video Note  I connected with@ on 02/05/20 at  9:30 AM EST by a video enabled telemedicine application and verified that I am speaking with the correct person using two identifiers.  Location: Patient:Home Provider: Office Participants: patient and provider  I discussed the limitations of evaluation and management by telemedicine and the availability of in person appointments. I also discussed with the patient that there may be a patient responsible charge related to this service. The patient expressed understanding and agreed to proceed.  CC: anxiety and GERD f/up  History of Present Illness: GERD (gastroesophageal reflux disease) Normal EGD Positive allergy patch test, started flonase, xyzaL and montelukast with significant improvement in post nasal drip and sinusitis. He also started immunotherapy injection per  Allergy and asthma. Completed current pantoprazole rx, then stop.  GAD (generalized anxiety disorder) Stable mood with effexor 37.5mg  and CBT sessions Maintain current medication  Depression screen [facility] 2/9 02/05/2020 11/01/2019 08/24/2019  Decreased Interest 0 0 0  Down, Depressed, Hopeless 0 0 1  PHQ - 2 Score 0 0 1  Altered sleeping - 0 3  Tired, decreased energy - 0 1  Change in appetite - 0 1  Feeling bad or failure about yourself  - 0 2  Trouble concentrating - 0 0  Moving slowly or fidgety/restless - 0 0  Suicidal thoughts - 0 0  PHQ-9 Score - 0 8  Difficult doing work/chores - Not difficult at all Very difficult   GAD 7 : Generalized Anxiety Score 02/05/2020 02/05/2020 11/01/2019 08/24/2019  Nervous, Anxious, on Edge 0 0 1 3  Control/stop worrying 0 0 0 3  Worry too much - different things 0 0 0 3  Trouble relaxing 0 1 0 3  Restless 0 1 0 1  Easily annoyed or irritable 0 0 1 1  Afraid - awful might happen 0 1 0 2  Total GAD 7 Score 0 3 2 16   Anxiety Difficulty Not difficult at all Not difficult at all Not difficult at all Very  difficult   Observations/Objective: Physical Exam Neurological:     Mental Status: Shane Munoz is alert and oriented to person, place, and time.  Psychiatric:        Mood and Affect: Mood normal.        Behavior: Behavior normal.        Thought Content: Thought content normal.        Judgment: Judgment normal.    Assessment and Plan: Brodey was seen today for follow-up.  Diagnoses and all orders for this visit:  GAD (generalized anxiety disorder)  Gastroesophageal reflux disease without esophagitis   Follow Up Instructions: See above   I discussed the assessment and treatment plan with the patient. The patient was provided an opportunity to ask questions and all were answered. The patient agreed with the plan and demonstrated an understanding of the instructions.   The patient was advised to call back or seek an in-person evaluation if the symptoms worsen or if the condition fails to improve as anticipated.  Gardiner Barefoot, NP

## 2020-02-05 NOTE — Assessment & Plan Note (Signed)
Stable mood with effexor 37.5mg  and CBT sessions Maintain current medication

## 2020-02-05 NOTE — Assessment & Plan Note (Signed)
Normal EGD Positive allergy patch test, started flonase, xyzaL and montelukast with significant improvement in post nasal drip and sinusitis. He also started immunotherapy injection per [facility]. Completed current pantoprazole rx, then stop.

## 2020-02-12 ENCOUNTER — Telehealth: Payer: 59 | Admitting: Emergency Medicine

## 2020-02-12 DIAGNOSIS — J029 Acute pharyngitis, unspecified: Secondary | ICD-10-CM

## 2020-02-12 NOTE — Progress Notes (Signed)
We are sorry that you are not feeling well.  Here is how we plan to help!  Your symptoms indicate a likely viral infection (Pharyngitis).   Pharyngitis is inflammation in the back of the throat which can cause a sore throat, scratchiness and sometimes difficulty swallowing.   Pharyngitis is typically caused by a respiratory virus and will just run its course.  Please keep in mind that your symptoms could last up to 10 days.  For throat pain, we recommend over the counter oral pain relief medications such as acetaminophen or aspirin, or anti-inflammatory medications such as ibuprofen or naproxen sodium.  Topical treatments such as oral throat lozenges or sprays may be used as needed.  Avoid close contact with loved ones, especially the very young and elderly.  Remember to wash your hands thoroughly throughout the day as this is the number one way to prevent the spread of infection and wipe down door knobs and counters with disinfectant.  After careful review of your answers, I would not recommend and antibiotic for your condition.  Antibiotics should not be used to treat conditions that we suspect are caused by viruses like the virus that causes the common cold or flu. However, some people can have Strep with atypical symptoms. You may need formal testing in clinic or office to confirm if your symptoms continue or worsen.  Providers prescribe antibiotics to treat infections caused by bacteria. Antibiotics are very powerful in treating bacterial infections when they are used properly.  To maintain their effectiveness, they should be used only when necessary.  Overuse of antibiotics has resulted in the development of super bugs that are resistant to treatment!    Home Care:  Only take medications as instructed by your medical team.  Do not drink alcohol while taking these medications.  A steam or ultrasonic humidifier can help congestion.  You can place a towel over your head and breathe in the steam from  hot water coming from a faucet.  Avoid close contacts especially the very young and the elderly.  Cover your mouth when you cough or sneeze.  Always remember to wash your hands.  Get Help Right Away If:  You develop worsening fever or throat pain.  You develop a severe head ache or visual changes.  Your symptoms persist after you have completed your treatment plan.  Make sure you  Understand these instructions.  Will watch your condition.  Will get help right away if you are not doing well or get worse.  Your e-visit answers were reviewed by a board certified advanced clinical practitioner to complete your personal care plan.  Depending on the condition, your plan could have included both over the counter or prescription medications.  If there is a problem please reply  once you have received a response from your provider.  Your safety is important to us.  If you have drug allergies check your prescription carefully.    You can use MyChart to ask questions about todays visit, request a non-urgent call back, or ask for a work or school excuse for 24 hours related to this e-Visit. If it has been greater than 24 hours you will need to follow up with your provider, or enter a new e-Visit to address those concerns.  You will get an e-mail in the next two days asking about your experience.  I hope that your e-visit has been valuable and will speed your recovery. Thank you for using e-visits.  Approximately 5 minutes was used   in reviewing the patient's chart, questionnaire, prescribing medications, and documentation.   

## 2020-02-15 ENCOUNTER — Encounter: Payer: Self-pay | Admitting: Nurse Practitioner

## 2020-02-23 ENCOUNTER — Other Ambulatory Visit: Payer: Self-pay | Admitting: Nurse Practitioner

## 2020-02-23 DIAGNOSIS — K2100 Gastro-esophageal reflux disease with esophagitis, without bleeding: Secondary | ICD-10-CM

## 2020-02-23 NOTE — Telephone Encounter (Signed)
Last vv 02/05/20 Last fill 11/20/19  #90/0

## 2020-03-28 ENCOUNTER — Other Ambulatory Visit: Payer: 59

## 2020-03-28 DIAGNOSIS — Z20822 Contact with and (suspected) exposure to covid-19: Secondary | ICD-10-CM

## 2020-03-30 LAB — NOVEL CORONAVIRUS, NAA: SARS-CoV-2, NAA: NOT DETECTED

## 2020-03-30 LAB — SARS-COV-2, NAA 2 DAY TAT

## 2020-05-08 ENCOUNTER — Ambulatory Visit (INDEPENDENT_AMBULATORY_CARE_PROVIDER_SITE_OTHER): Payer: 59 | Admitting: Nurse Practitioner

## 2020-05-08 ENCOUNTER — Other Ambulatory Visit: Payer: Self-pay

## 2020-05-08 ENCOUNTER — Encounter: Payer: Self-pay | Admitting: Nurse Practitioner

## 2020-05-08 VITALS — BP 124/80 | HR 60 | Temp 97.7°F | Ht 69.75 in | Wt 226.8 lb

## 2020-05-08 DIAGNOSIS — Z0001 Encounter for general adult medical examination with abnormal findings: Secondary | ICD-10-CM | POA: Diagnosis not present

## 2020-05-08 DIAGNOSIS — E785 Hyperlipidemia, unspecified: Secondary | ICD-10-CM

## 2020-05-08 LAB — COMPREHENSIVE METABOLIC PANEL
ALT: 21 U/L (ref 0–53)
AST: 17 U/L (ref 0–37)
Albumin: 4.6 g/dL (ref 3.5–5.2)
Alkaline Phosphatase: 49 U/L (ref 39–117)
BUN: 16 mg/dL (ref 6–23)
CO2: 30 mEq/L (ref 19–32)
Calcium: 9.5 mg/dL (ref 8.4–10.5)
Chloride: 103 mEq/L (ref 96–112)
Creatinine, Ser: 1.07 mg/dL (ref 0.40–1.50)
GFR: 86.14 mL/min (ref >60.00)
Glucose, Bld: 100 mg/dL — ABNORMAL HIGH (ref 70–99)
Potassium: 4.5 mEq/L (ref 3.5–5.1)
Sodium: 137 mEq/L (ref 135–145)
Total Bilirubin: 0.7 mg/dL (ref 0.2–1.2)
Total Protein: 7.2 g/dL (ref 6.0–8.3)

## 2020-05-08 LAB — LIPID PANEL
Cholesterol: 188 mg/dL (ref 0–200)
HDL: 46.6 mg/dL (ref >39.00)
LDL Cholesterol: 106 mg/dL — ABNORMAL HIGH (ref 0–99)
NonHDL: 141.06
Total CHOL/HDL Ratio: 4
Triglycerides: 177 mg/dL — ABNORMAL HIGH (ref 0.0–149.0)
VLDL: 35.4 mg/dL (ref 0.0–40.0)

## 2020-05-08 LAB — CBC
HCT: 47.6 % (ref 39.0–52.0)
Hemoglobin: 16.3 g/dL (ref 13.0–17.0)
MCHC: 34.2 g/dL (ref 30.0–36.0)
MCV: 85.9 fl (ref 78.0–100.0)
Platelets: 212 10*3/uL (ref 150.0–400.0)
RBC: 5.54 Mil/uL (ref 4.22–5.81)
RDW: 13.4 % (ref 11.5–15.5)
WBC: 5.5 10*3/uL (ref 4.0–10.5)
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
## 2020-05-08 NOTE — Patient Instructions (Addendum)
Go to lab for blood draw  Continue heart healthy diet and regular exercise.  Preventive Care 6-42 Years Old, Male Preventive care refers to lifestyle choices and visits with your health care provider that can promote health and wellness. This includes:  A yearly physical exam. This is also called an annual wellness visit.  Regular dental and eye exams.  Immunizations.  Screening for certain conditions.  Healthy lifestyle choices, such as: ? Eating a healthy diet. ? Getting regular exercise. ? Not using drugs or products that contain nicotine and tobacco. ? Limiting alcohol use. What can I expect for my preventive care visit? Physical exam Your health care provider will check your:  Height and weight. These may be used to calculate your BMI (body mass index). BMI is a measurement that tells if you are at a healthy weight.  Heart rate and blood pressure.  Body temperature.  Skin for abnormal spots. Counseling Your health care provider may ask you questions about your:  Past medical problems.  Family's medical history.  Alcohol, tobacco, and drug use.  Emotional well-being.  Home life and relationship well-being.  Sexual activity.  Diet, exercise, and sleep habits.  Work and work Astronomer.  Access to firearms. What immunizations do I need? Vaccines are usually given at various ages, according to a schedule. Your health care provider will recommend vaccines for you based on your age, medical history, and lifestyle or other factors, such as travel or where you work.   What tests do I need? Blood tests  Lipid and cholesterol levels. These may be checked every 5 years, or more often if you are over 37 years old.  Hepatitis C test.  Hepatitis B test. Screening  Lung cancer screening. You may have this screening every year starting at age 86 if you have a 30-pack-year history of smoking and currently smoke or have quit within the past 15 years.  Prostate  cancer screening. Recommendations will vary depending on your family history and other risks.  Genital exam to check for testicular cancer or hernias.  Colorectal cancer screening. ? All adults should have this screening starting at age 50 and continuing until age 84. ? Your health care provider may recommend screening at age 12 if you are at increased risk. ? You will have tests every 1-10 years, depending on your results and the type of screening test.  Diabetes screening. ? This is done by checking your blood sugar (glucose) after you have not eaten for a while (fasting). ? You may have this done every 1-3 years.  STD (sexually transmitted disease) testing, if you are at risk. Follow these instructions at home: Eating and drinking  Eat a diet that includes fresh fruits and vegetables, whole grains, lean protein, and low-fat dairy products.  Take vitamin and mineral supplements as recommended by your health care provider.  Do not drink alcohol if your health care provider tells you not to drink.  If you drink alcohol: ? Limit how much you have to 0-2 drinks a day. ? Be aware of how much alcohol is in your drink. In the [place] one drink equals one 12 oz bottle of beer (355 mL), one 5 oz glass of wine (148 mL), or one 1 oz glass of hard liquor (44 mL).   Lifestyle  Take daily care of your teeth and gums. Brush your teeth every morning and night with fluoride toothpaste. Floss one time each day.  Stay active. Exercise for at least 30 minutes 5  or more days each week.  Do not use any products that contain nicotine or tobacco, such as cigarettes, e-cigarettes, and chewing tobacco. If you need help quitting, ask your health care provider.  Do not use drugs.  If you are sexually active, practice safe sex. Use a condom or other form of protection to prevent STIs (sexually transmitted infections).  If told by your health care provider, take low-dose aspirin daily starting at age  38.  Find healthy ways to cope with stress, such as: ? Meditation, yoga, or listening to music. ? Journaling. ? Talking to a trusted person. ? Spending time with friends and family. Safety  Always wear your seat belt while driving or riding in a vehicle.  Do not drive: ? If you have been drinking alcohol. Do not ride with someone who has been drinking. ? When you are tired or distracted. ? While texting.  Wear a helmet and other protective equipment during sports activities.  If you have firearms in your house, make sure you follow all gun safety procedures. What's next?  Go to your health care provider once a year for an annual wellness visit.  Ask your health care provider how often you should have your eyes and teeth checked.  Stay up to date on all vaccines. This information is not intended to replace advice given to you by your health care provider. Make sure you discuss any questions you have with your health care provider. Document Revised: 11/22/2018 Document Reviewed: 02/17/2018 Elsevier Patient Education  2021 [employer].

## 2020-05-09 LAB — TSH: TSH: 2.76 u[IU]/mL (ref 0.35–4.50)

## 2020-05-12 ENCOUNTER — Encounter: Payer: Self-pay | Admitting: Nurse Practitioner

## 2020-05-12 NOTE — Assessment & Plan Note (Signed)
Normal TSH, CBC and CMP Abnormal lipid panel due to elevated triglyceride: low carb/low fat diet and daily exercise to promote weight loss is essential. Repeat in 35months (fasting)

## 2020-05-21 ENCOUNTER — Other Ambulatory Visit: Payer: Self-pay | Admitting: Nurse Practitioner

## 2020-05-21 DIAGNOSIS — K2100 Gastro-esophageal reflux disease with esophagitis, without bleeding: Secondary | ICD-10-CM

## 2020-07-04 ENCOUNTER — Telehealth: Payer: Self-pay | Admitting: Nurse Practitioner

## 2020-07-04 NOTE — Telephone Encounter (Signed)
Patient would like to update his pharmacy information to [facility] on [address] [place], [place], [place]. Phone # [phone].

## 2020-07-05 NOTE — Telephone Encounter (Signed)
Pharmacy updated.

## 2020-08-06 ENCOUNTER — Ambulatory Visit: Payer: 59 | Admitting: Nurse Practitioner

## 2020-08-06 ENCOUNTER — Other Ambulatory Visit: Payer: Self-pay

## 2020-08-07 ENCOUNTER — Ambulatory Visit: Payer: 59 | Admitting: Nurse Practitioner

## 2020-08-07 ENCOUNTER — Encounter: Payer: Self-pay | Admitting: Nurse Practitioner

## 2020-08-07 VITALS — BP 120/72 | HR 60 | Temp 97.0°F | Ht 70.0 in | Wt 224.8 lb

## 2020-08-07 DIAGNOSIS — H90A22 Sensorineural hearing loss, unilateral, left ear, with restricted hearing on the contralateral side: Secondary | ICD-10-CM

## 2020-08-07 DIAGNOSIS — H9312 Tinnitus, left ear: Secondary | ICD-10-CM | POA: Diagnosis not present

## 2020-08-07 DIAGNOSIS — M545 Low back pain, unspecified: Secondary | ICD-10-CM | POA: Diagnosis not present

## 2020-08-07 MED ORDER — TIZANIDINE HCL 2 MG PO CAPS: 2.0000 mg | ORAL_CAPSULE | Freq: Every evening | ORAL | 0 refills | Status: DC | PRN

## 2020-08-07 NOTE — Progress Notes (Signed)
Subjective:  Patient ID: Shane Munoz, adult    DOB: 03/21/1980  Age: 40 y.o. MRN: [id number]  CC: Acute Visit (Pt c/o low left back pain and ringing in ears. Pt states he pulled a muscle in his back 2 weeks ago but states that the pain has since gotten better. Pt states the ringing in his ears is only when he goes to bed at night. )  Ear Fullness  There is pain in the left ear. This is a recurrent problem. The current episode started 1 to 4 weeks ago. The problem occurs constantly. The problem has been unchanged. There has been no fever. The pain is at a severity of 0/10. The patient is experiencing no pain. Pertinent negatives include no abdominal pain, coughing, diarrhea, ear discharge, headaches, hearing loss, neck pain, rash, rhinorrhea, sore throat or vomiting. Shane Munoz has tried nothing for the symptoms. Shane Munoz's past medical history is significant for a chronic ear infection, hearing loss and a tympanostomy tube.  Back Pain This is a new problem. The current episode started in the past 7 days. The problem occurs constantly. The problem has been gradually improving since onset. The pain is present in the lumbar spine. The quality of the pain is described as aching and cramping. The pain does not radiate. The pain is worse during the night. The symptoms are aggravated by bending, lying down and twisting. Pertinent negatives include no abdominal pain, bladder incontinence, bowel incontinence, dysuria, fever, headaches, leg pain, numbness, paresis, paresthesias, pelvic pain, perianal numbness, tingling or weakness. Risk factors include obesity and poor posture. Shane Munoz has tried bed rest (tylenol) for the symptoms. The treatment provided mild relief.   Denies any head injury or recent sinusitis or vertigo Denies any exposure to loud sounds   Hearing Screening   125Hz  250Hz  500Hz  1000Hz  2000Hz  3000Hz  4000Hz  6000Hz  8000Hz   Right ear:   y y y n y n n  Left ear:   y y y n y n n    Reviewed past Medical, Social and Family history today.  Outpatient Medications Prior to Visit  Medication Sig Dispense Refill  . fluticasone (FLONASE) 50 MCG/ACT nasal spray Place 2 sprays into both nostrils daily.    levocetirizine (XYZAL) 5 MG tablet Take 5 mg by mouth every evening.    . montelukast (SINGULAIR) 10 MG tablet Take 10 mg by mouth at bedtime.    . valACYclovir (VALTREX) 1000 MG tablet TAKE 2 TABLETS(2000 MG) BY MOUTH TWICE DAILY FOR 1 DAY 4 tablet 0  . venlafaxine XR (EFFEXOR-XR) 37.5 MG 24 hr capsule Take 1 capsule (37.5 mg total) by mouth daily with breakfast. 90 capsule 3  . anastrozole (ARIMIDEX) 1 MG tablet Take 1 mg by mouth daily.    . clomiPHENE (CLOMID) 50 MG tablet Take 50 mg by mouth daily.     No facility-administered medications prior to visit.    ROS See HPI  Objective:  BP 120/72 (BP Location: Left Arm, Patient Position: Sitting, Cuff Size: Large)   Pulse 60   Temp (!) 97 F (36.1 C) (Temporal)   Ht 5\' 10"  (1.778 m)   Wt 224 lb 12.8 oz (102 kg)   SpO2 97%   BMI 32.26 kg/m   Physical Exam Vitals reviewed.  HENT:     Right Ear: Hearing, ear canal and external ear normal. No middle ear effusion. There is no impacted cerumen. No mastoid tenderness. Tympanic membrane is scarred. Tympanic membrane is not injected, perforated  or erythematous.     Left Ear: Ear canal and external ear normal. Decreased hearing noted.  No middle ear effusion. There is no impacted cerumen. No mastoid tenderness. Tympanic membrane is retracted. Tympanic membrane is not injected, scarred, perforated or erythematous.     Ears:     Weber exam findings: lateralizes right.    Right Rinne: AC > BC.    Left Rinne: AC > BC. Eyes:     Extraocular Movements: Extraocular movements intact.     Conjunctiva/sclera: Conjunctivae normal.     Pupils: Pupils are equal, round, and reactive to light.  Cardiovascular:     Rate and Rhythm: Normal rate.     Pulses: Normal pulses.   Musculoskeletal:        General: Tenderness and signs of injury present.     Lumbar back: Spasms and tenderness present. Normal range of motion. Negative right straight leg raise test and negative left straight leg raise test.     Right lower leg: No edema.     Left lower leg: No edema.  Neurological:     Mental Status: Shane Munoz is oriented to person, place, and time.     Cranial Nerves: Cranial nerves are intact. No cranial nerve deficit.     Gait: Gait is intact.    Assessment & Plan:  This visit occurred during the SARS-CoV-2 public health emergency.  Safety protocols were in place, including screening questions prior to the visit, additional usage of staff PPE, and extensive cleaning of exam room while observing appropriate contact time as indicated for disinfecting solutions.   [name] was seen today for acute visit.  Diagnoses and all orders for this visit:  Tinnitus of left ear -     Ambulatory referral to Audiology  Acute left-sided low back pain without sciatica -     tizanidine (ZANAFLEX) 2 MG capsule; Take 1-2 capsules (2-4 mg total) by mouth at bedtime as needed for muscle spasms.  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  Alysia Penna, NP

## 2020-08-07 NOTE — Patient Instructions (Addendum)
You will be contacted to schedule appt with audiology  Lumbar Strain A lumbar strain, which is sometimes called a low-back strain, is a stretch or tear in a muscle or the strong cords of tissue that attach muscle to bone (tendons) in the lower back (lumbar spine). This type of injury occurs when muscles or tendons are torn or are stretched beyond their limits. Lumbar strains can range from mild to severe. Mild strains may involve stretching a muscle or tendon without tearing it. These may heal in 1-2 weeks. More severe strains involve tearing of muscle fibers or tendons. These will cause more pain and may take 6-8 weeks to heal. What are the causes? This condition may be caused by:  Trauma, such as a fall or a hit to the body.  Twisting or overstretching the back. This may result from doing activities that need a lot of energy, such as lifting heavy objects. What increases the risk? This injury is more common in:  Athletes.  People with obesity.  People who do repeated lifting, bending, or other movements that involve their back. What are the signs or symptoms? Symptoms of this condition may include:  Sharp or dull pain in the lower back that does not go away. The pain may extend to the buttocks.  Stiffness or limited range of motion.  Sudden muscle tightening (spasms). How is this diagnosed? This condition may be diagnosed based on:  Your symptoms.  Your medical history.  A physical exam.  Imaging tests, such as: ? X-rays. ? MRI. How is this treated? Treatment for this condition may include:  Rest.  Applying heat and cold to the affected area.  Over-the-counter medicines to help relieve pain and inflammation, such as NSAIDs.  Prescription pain medicine and muscle relaxants may be needed for a short time.  Physical therapy. Follow these instructions at home: Managing pain, stiffness, and swelling  If directed, put ice on the injured area during the first 24 hours  after your injury. ? Put ice in a plastic bag. ? Place a towel between your skin and the bag. ? Leave the ice on for 20 minutes, 2-3 times a day.  If directed, apply heat to the affected area as often as told by your health care provider. Use the heat source that your health care provider recommends, such as a moist heat pack or a heating pad. ? Place a towel between your skin and the heat source. ? Leave the heat on for 20-30 minutes. ? Remove the heat if your skin turns bright red. This is especially important if you are unable to feel pain, heat, or cold. You may have a greater risk of getting burned.      Activity  Rest and return to your normal activities as told by your health care provider. Ask your health care provider what activities are safe for you.  Do exercises as told by your health care provider. Medicines  Take over-the-counter and prescription medicines only as told by your health care provider.  Ask your health care provider if the medicine prescribed to you: ? Requires you to avoid driving or using heavy machinery. ? Can cause constipation. You may need to take these actions to prevent or treat constipation:  Drink enough fluid to keep your urine pale yellow.  Take over-the-counter or prescription medicines.  Eat foods that are high in fiber, such as beans, whole grains, and fresh fruits and vegetables.  Limit foods that are high in fat and processed  sugars, such as fried or sweet foods. Injury prevention To prevent a future low-back injury:  Always warm up properly before physical activity or sports.  Cool down and stretch after being active.  Use correct form when playing sports and lifting heavy objects. Bend your knees before you lift heavy objects.  Use good posture when sitting and standing.  Stay physically fit and keep a healthy weight. ? Do at least 150 minutes of moderate-intensity exercise each week, such as brisk walking or water  aerobics. ? Do strength exercises at least 2 times each week.   General instructions  Do not use any products that contain nicotine or tobacco, such as cigarettes, e-cigarettes, and chewing tobacco. If you need help quitting, ask your health care provider.  Keep all follow-up visits as told by your health care provider. This is important. Contact a health care provider if:  Your back pain does not improve after 6 weeks of treatment.  Your symptoms get worse. Get help right away if:  Your back pain is severe.  You are unable to stand or walk.  You develop pain in your legs.  You develop weakness in your buttocks or legs.  You have difficulty controlling when you urinate or when you have a bowel movement. ? You have frequent, painful, or bloody urination. ? You have a temperature over 101.59F (38.3C) Summary  A lumbar strain, which is sometimes called a low-back strain, is a stretch or tear in a muscle or the strong cords of tissue that attach muscle to bone (tendons) in the lower back (lumbar spine).  This type of injury occurs when muscles or tendons are torn or are stretched beyond their limits.  Rest and return to your normal activities as told by your health care provider. If directed, apply heat and ice to the affected area as often as told by your health care provider.  Take over-the-counter and prescription medicines only as told by your health care provider.  Contact a health care provider if you have new or worsening symptoms. This information is not intended to replace advice given to you by your health care provider. Make sure you discuss any questions you have with your health care provider. Document Revised: 12/23/2017 Document Reviewed: 12/23/2017 Elsevier Patient Education  2021 [employer].

## 2020-10-03 ENCOUNTER — Encounter: Payer: Self-pay | Admitting: Nurse Practitioner

## 2020-10-03 ENCOUNTER — Ambulatory Visit: Payer: 59 | Admitting: Nurse Practitioner

## 2020-10-03 ENCOUNTER — Other Ambulatory Visit: Payer: Self-pay

## 2020-10-03 VITALS — BP 122/74 | HR 60 | Temp 97.0°F | Ht 71.0 in | Wt 230.4 lb

## 2020-10-03 DIAGNOSIS — M7702 Medial epicondylitis, left elbow: Secondary | ICD-10-CM

## 2020-10-03 MED ORDER — DICLOFENAC SODIUM 1 % EX GEL: 2.0000 g | Freq: Three times a day (TID) | CUTANEOUS | 0 refills | Status: DC

## 2020-10-03 NOTE — Patient Instructions (Addendum)
Use a counterforce brace daily x 2weeks. Use diclofenac gel as prescribed.  Golfer's Elbow  Golfer's elbow (medial epicondylitis) is a condition that results from inflammation of the strong bands of tissue (tendons) that attach your forearm muscles to the inside of your bone at the elbow. These tendons affect the muscles that bend the palm toward the wrist (flexion). The tendons become less flexible with age. This condition is called golfer's elbow because it is more common among people who constantly bend and twist their wrists, such as golfers. This injury isusually caused by repeated use of the same muscles. What are the causes? This condition is caused by: Repeatedly flexing, turning, or twisting your wrist. Frequently gripping objects with your hands. Sudden injury. What increases the risk? This condition is more likely to develop in people who play golf, baseball, or tennis. This injury is more common among people who have jobs that require the constant use of their hands, such as: People who use computers. Carpenters. Butchers. Musicians. What are the signs or symptoms? This condition causes elbow pain that may spread to your forearm and upper arm. Symptoms of this condition include: Pain at the inner elbow, forearm, or wrist. A weak grip in the hand. The pain may get worse when you bend your wrist downward. How is this diagnosed? This condition is diagnosed based on your symptoms, your medical history, and a physical exam. During the exam, your health care provider may: Test your grip strength. Move your wrist to check for pain. You may also have an MRI to: Confirm the diagnosis. Look for other issues. Check for tears in the ligaments, muscles, or tendons. How is this treated? Treatment for this condition includes: Stopping all activities that make you bend or twist your elbow or wrist and waiting until your pain and other symptoms go away before resuming those  activities. Wearing an elbow brace or wrist splint to restrict the movements that cause symptoms. Icing your inner elbow, forearm, or wrist to relieve pain. Taking NSAIDs, such as ibuprofen, or getting corticosteroid injections to reduce pain and swelling. Doing stretching, range-of-motion, and strengthening exercises (physical therapy) as told by your health care provider. In rare cases, surgery may be needed if your condition does not improve. Follow these instructions at home: If you have a brace or splint: Wear the brace or splint as told by your health care provider. Remove it only as told by your health care provider. Check the skin around the brace or splint every day. Tell your health care provider about any concerns. Loosen the brace or splint if your fingers tingle, become numb, or turn cold and blue. Keep it clean. If the brace or splint is not waterproof: Do not let it get wet. Cover it with a watertight covering when you take a bath or shower. Managing pain, stiffness, and swelling  If directed, put ice on the injured area. To do this: If you have a removable brace or splint, remove it as told by your health care provider. Put ice in a plastic bag. Place a towel between your skin and the bag. Leave the ice on for 20 minutes, 2-3 times a day. Remove the ice if your skin turns bright red. This is very important. If you cannot feel pain, heat, or cold, you have a greater risk of damage to the area. Move your fingers often to avoid stiffness and swelling.  Activity Rest your injured area as told by your health care provider. Return to your  normal activities as told by your health care provider. Ask your health care provider what activities are safe for you. Do exercises as told by your health care provider. Lifestyle If your condition is caused by sports, work with a trainer to make sure that you: Use the correct technique. Use the proper equipment. If your condition is work  related, talk with your employer about ways to manage your condition at work. General instructions Take over-the-counter and prescription medicines only as told by your health care provider. Do not use any products that contain nicotine or tobacco. These products include cigarettes, chewing tobacco, and vaping devices, such as e-cigarettes. If you need help quitting, ask your health care provider. Keep all follow-up visits. This is important. How is this prevented? Before and after activity: Warm up and stretch before being active. Cool down and stretch after being active. Give your body time to rest between periods of activity. During activity: Make sure to use equipment that fits you. If you play golf, slow your golf swing to reduce shock in the arm when making contact with the ball. Maintain physical fitness, including: Strength. Flexibility. Endurance. Do exercises to strengthen the forearm muscles. Contact a health care provider if: Your pain does not improve or it gets worse. You notice numbness in your hand. Get help right away if: Your pain is severe. You cannot move your wrist. Summary Golfer's elbow, also called medial epicondylitis, is a condition that results from inflammation of the strong bands of tissue (tendons) that attach your forearm muscles to the inside of your bone at the elbow. This injury usually results from overuse. Symptoms of this condition include decreased grip strength and pain at the inner elbow, forearm, or wrist. This injury is treated with rest, a brace or splint, ice, medicines, physical therapy, and surgery as needed. This information is not intended to replace advice given to you by your health care provider. Make sure you discuss any questions you have with your healthcare provider. Document Revised: 09/05/2019 Document Reviewed: 09/05/2019 Elsevier Patient Education  2022 [employer].

## 2020-10-03 NOTE — Progress Notes (Signed)
Subjective:  Patient ID: Shane Munoz, adult    DOB: [birth year]  Age: 40 y.o. MRN: [id number]  CC: Acute Visit (Pt c/o left elbow pain x 2 months. Pain at the bend of the elbow to the side, notices it more when he touches it our pressure is applied to it. )  HPI Shane Munoz presents with left elbow pain x 63months, unknown cause, denies any trauma or fever or redness or swelling. He has pain with palpation and pushing against anything.  No hx of gout or previous trauma or fracture.  Reviewed past Medical, Social and Family history today.  Outpatient Medications Prior to Visit  Medication Sig Dispense Refill   levocetirizine (XYZAL) 5 MG tablet Take 5 mg by mouth every evening.     montelukast (SINGULAIR) 10 MG tablet Take 10 mg by mouth at bedtime.     tizanidine (ZANAFLEX) 2 MG capsule Take 1-2 capsules (2-4 mg total) by mouth at bedtime as needed for muscle spasms. 14 capsule 0   valACYclovir (VALTREX) 1000 MG tablet TAKE 2 TABLETS(2000 MG) BY MOUTH TWICE DAILY FOR 1 DAY 4 tablet 0   venlafaxine XR (EFFEXOR-XR) 37.5 MG 24 hr capsule Take 1 capsule (37.5 mg total) by mouth daily with breakfast. 90 capsule 3   fluticasone (FLONASE) 50 MCG/ACT nasal spray Place 2 sprays into both nostrils daily. (Patient not taking: Reported on 10/03/2020)     No facility-administered medications prior to visit.    ROS See HPI  Objective:  BP 122/74 (BP Location: Right Arm, Patient Position: Sitting, Cuff Size: Large)   Pulse 60   Temp (!) 97 F (36.1 C) (Temporal)   Ht 5\' 11"  (1.803 m)   Wt 230 lb 6.4 oz (104.5 kg)   SpO2 98%   BMI 32.13 kg/m   Physical Exam Vitals reviewed.  Musculoskeletal:        General: Tenderness present. No swelling or deformity.     Right elbow: No swelling, deformity, effusion or lacerations. Normal range of motion. No tenderness. No radial head, medial epicondyle, lateral epicondyle or olecranon process tenderness.     Left elbow: No swelling, deformity, effusion or  lacerations. Normal range of motion. Tenderness present in medial epicondyle. No radial head, lateral epicondyle or olecranon process tenderness.     Right forearm: Normal.     Left forearm: Normal.     Right wrist: Normal.     Left wrist: Normal.     Right hand: Normal.     Left hand: Normal.  Skin:    Findings: No erythema or rash.  Neurological:     Mental Status: Shane Munoz is alert.    Assessment & Plan:  This visit occurred during the SARS-CoV-2 public health emergency.  Safety protocols were in place, including screening questions prior to the visit, additional usage of staff PPE, and extensive cleaning of exam room while observing appropriate contact time as indicated for disinfecting solutions.   Shane Munoz was seen today for acute visit.  Diagnoses and all orders for this visit:  Medial epicondylitis of elbow, left -     diclofenac Sodium (VOLTAREN) 1 % GEL; Apply 2 g topically in the morning, at noon, and at bedtime. Avoid oral NSAIDs due to hx of GERD with esophagitis Use a counterforce brace daily x 2weeks. Use diclofenac gel as prescribed.  Problem List Items Addressed This Visit   None Visit Diagnoses     Medial epicondylitis of elbow, left    -  Primary  Relevant Medications   diclofenac Sodium (VOLTAREN) 1 % GEL       Follow-up: Return if symptoms worsen or fail to improve.  Alysia Penna, NP

## 2020-11-09 ENCOUNTER — Other Ambulatory Visit: Payer: Self-pay | Admitting: Nurse Practitioner

## 2020-11-09 DIAGNOSIS — Z8659 Personal history of other mental and behavioral disorders: Secondary | ICD-10-CM

## 2020-11-09 DIAGNOSIS — F411 Generalized anxiety disorder: Secondary | ICD-10-CM

## 2020-11-13 ENCOUNTER — Other Ambulatory Visit: Payer: Self-pay

## 2020-11-13 ENCOUNTER — Ambulatory Visit: Payer: 59 | Admitting: Nurse Practitioner

## 2020-11-13 ENCOUNTER — Encounter: Payer: Self-pay | Admitting: Nurse Practitioner

## 2020-11-13 VITALS — BP 116/76 | HR 70 | Temp 97.0°F | Ht 70.0 in | Wt 232.6 lb

## 2020-11-13 DIAGNOSIS — F411 Generalized anxiety disorder: Secondary | ICD-10-CM

## 2020-11-13 DIAGNOSIS — J301 Allergic rhinitis due to pollen: Secondary | ICD-10-CM | POA: Insufficient documentation

## 2020-11-13 DIAGNOSIS — I7789 Other specified disorders of arteries and arterioles: Secondary | ICD-10-CM | POA: Diagnosis not present

## 2020-11-13 DIAGNOSIS — Z23 Encounter for immunization: Secondary | ICD-10-CM

## 2020-11-13 DIAGNOSIS — J3081 Allergic rhinitis due to animal (cat) (dog) hair and dander: Secondary | ICD-10-CM | POA: Insufficient documentation

## 2020-11-13 DIAGNOSIS — Z8659 Personal history of other mental and behavioral disorders: Secondary | ICD-10-CM

## 2020-11-13 MED ORDER — VENLAFAXINE HCL ER 37.5 MG PO CP24: 37.5000 mg | ORAL_CAPSULE | Freq: Every day | ORAL | 3 refills | Status: DC

## 2020-11-13 NOTE — Assessment & Plan Note (Signed)
Stable mood Maintain effexor dose Refill sent.

## 2020-11-13 NOTE — Assessment & Plan Note (Signed)
CT chest completed 08/2019: normal aorta caliber.

## 2020-11-13 NOTE — Progress Notes (Signed)
Subjective:  Patient ID: Shane Munoz, adult    DOB: 22-Jun-1980  Age: 40 y.o. MRN: [id number]  CC: Follow-up (6 month f/u on anxiety and depression. )  HPI  Enlarged thoracic aorta (HCC) CT chest completed 08/2019: normal aorta caliber.  GAD (generalized anxiety disorder) Stable mood Maintain effexor dose Refill sent.  Reviewed past Medical, Social and Family history today.  Outpatient Medications Prior to Visit  Medication Sig Dispense Refill   diclofenac Sodium (VOLTAREN) 1 % GEL Apply 2 g topically in the morning, at noon, and at bedtime. 50 g 0   levocetirizine (XYZAL) 5 MG tablet Take 5 mg by mouth every evening.     montelukast (SINGULAIR) 10 MG tablet Take 10 mg by mouth at bedtime.     tizanidine (ZANAFLEX) 2 MG capsule Take 1-2 capsules (2-4 mg total) by mouth at bedtime as needed for muscle spasms. 14 capsule 0   valACYclovir (VALTREX) 1000 MG tablet TAKE 2 TABLETS(2000 MG) BY MOUTH TWICE DAILY FOR 1 DAY 4 tablet 0   venlafaxine XR (EFFEXOR-XR) 37.5 MG 24 hr capsule Take 1 capsule (37.5 mg total) by mouth daily with breakfast. 90 capsule 3   No facility-administered medications prior to visit.    ROS See HPI  Objective:  BP 116/76 (BP Location: Left Arm, Patient Position: Sitting, Cuff Size: Large)   Pulse 70   Temp (!) 97 F (36.1 C) (Temporal)   Ht 5\' 10"  (1.778 m)   Wt 232 lb 9.6 oz (105.5 kg)   SpO2 98%   BMI 33.37 kg/m   Physical Exam Vitals reviewed.  Pulmonary:     Effort: Pulmonary effort is normal.  Neurological:     Mental Status: Shane Munoz is alert and oriented to person, place, and time.  Psychiatric:        Mood and Affect: Mood normal.        Behavior: Behavior normal.        Thought Content: Thought content normal.        Judgment: Judgment normal.    Assessment & Plan:  This visit occurred during the SARS-CoV-2 public health emergency.  Safety protocols were in place, including screening questions prior to the visit, additional  usage of staff PPE, and extensive cleaning of exam room while observing appropriate contact time as indicated for disinfecting solutions.   Shane Munoz was seen today for follow-up.  Diagnoses and all orders for this visit:  GAD (generalized anxiety disorder) -     venlafaxine XR (EFFEXOR-XR) 37.5 MG 24 hr capsule; Take 1 capsule (37.5 mg total) by mouth daily with breakfast.  Flu vaccine need -     Flu Vaccine QUAD 6+ mos PF IM (Fluarix Quad PF)  History of posttraumatic stress disorder (PTSD) -     venlafaxine XR (EFFEXOR-XR) 37.5 MG 24 hr capsule; Take 1 capsule (37.5 mg total) by mouth daily with breakfast.  Enlarged thoracic aorta (HCC)   Problem List Items Addressed This Visit       Cardiovascular and Mediastinum   RESOLVED: Enlarged thoracic aorta (HCC)    CT chest completed 08/2019: normal aorta caliber.        Other   GAD (generalized anxiety disorder) - Primary    Stable mood Maintain effexor dose Refill sent.      Relevant Medications   venlafaxine XR (EFFEXOR-XR) 37.5 MG 24 hr capsule   History of posttraumatic stress disorder (PTSD)   Relevant Medications   venlafaxine XR (EFFEXOR-XR) 37.5 MG 24 hr capsule  Other Visit Diagnoses     Flu vaccine need       Relevant Orders   Flu Vaccine QUAD 6+ mos PF IM (Fluarix Quad PF) (Completed)       Follow-up: Return in about 6 months (around 05/13/2021) for CPE (fasting).  Alysia Penna, NP

## 2020-11-15 NOTE — Telephone Encounter (Signed)
Duplicate request

## 2020-12-17 ENCOUNTER — Encounter: Payer: 59 | Admitting: Nurse Practitioner

## 2020-12-17 ENCOUNTER — Other Ambulatory Visit: Payer: Self-pay

## 2020-12-17 ENCOUNTER — Encounter: Payer: Self-pay | Admitting: Nurse Practitioner

## 2020-12-17 NOTE — Progress Notes (Signed)
Appt not needed CPE completed 05/08/2020. Form completed and returned to Shane Munoz This encounter was created in error - please disregard.

## 2021-05-28 LAB — HEMOGLOBIN A1C: Hemoglobin A1C: 5.8

## 2021-06-02 ENCOUNTER — Ambulatory Visit: Payer: 59 | Admitting: Nurse Practitioner

## 2021-06-02 ENCOUNTER — Encounter: Payer: Self-pay | Admitting: Nurse Practitioner

## 2021-06-02 VITALS — BP 130/84 | HR 82 | Temp 97.0°F | Ht 70.0 in | Wt 248.0 lb

## 2021-06-02 DIAGNOSIS — F411 Generalized anxiety disorder: Secondary | ICD-10-CM

## 2021-06-02 MED ORDER — VENLAFAXINE HCL ER 75 MG PO CP24: 75.0000 mg | ORAL_CAPSULE | Freq: Every day | ORAL | 3 refills | Status: DC

## 2021-06-02 NOTE — Progress Notes (Signed)
? ?             Established Patient Visit ? ?Patient: Shane Munoz   DOB: [birth year]   41 y.o. Adult  MRN: [id number] ?Visit Date: 06/02/2021 ? ?Subjective:  ?  ?Chief Complaint  ?Patient presents with  ? Follow-up  ?  Patient would like to discuss possible increase in medication (Venlafaxine) per his therapist.   ? ?HPI ?GAD (generalized anxiety disorder) ?Acute exacerbation x 58months due to school load, training to become a Educational psychologist, and reconnecting with estranged mother. ?Ongoing sessions with therapist once a month. ?Denies any SI/HI or hallucinations. ? ?Increase effexor to 75mg  daily ?Maintain sessions with therapist and possibly meet every 2weeks ?F/up in 33month ? ?Reviewed medical, surgical, and social history today ? ?Medications: ?Outpatient Medications Prior to Visit  ?Medication Sig  ? albuterol (VENTOLIN HFA) 108 (90 Base) MCG/ACT inhaler INHALE ONE TO TWO PUFFS BY MOUTH EVERY 4 TO 6 HOURS AS NEEDED FOR COUGH AND FOR WHEEZING  ? azelastine (ASTELIN) 0.1 % nasal spray SPRAY ONE TO TWO SPRAYS IN EACH NOSTRIL TWICE DAILY  ? diclofenac Sodium (VOLTAREN) 1 % GEL Apply 2 g topically in the morning, at noon, and at bedtime.  ? EPINEPHrine 0.3 mg/0.3 mL IJ SOAJ injection See admin instructions.  ? levocetirizine (XYZAL) 5 MG tablet Take 5 mg by mouth every evening.  ? montelukast (SINGULAIR) 10 MG tablet Take 10 mg by mouth at bedtime.  ? tizanidine (ZANAFLEX) 2 MG capsule Take 1-2 capsules (2-4 mg total) by mouth at bedtime as needed for muscle spasms.  ? valACYclovir (VALTREX) 1000 MG tablet TAKE 2 TABLETS(2000 MG) BY MOUTH TWICE DAILY FOR 1 DAY  ? [DISCONTINUED] venlafaxine XR (EFFEXOR-XR) 37.5 MG 24 hr capsule Take 1 capsule (37.5 mg total) by mouth daily with breakfast.  ? [DISCONTINUED] anastrozole (ARIMIDEX) 1 MG tablet Take 1 tablet by mouth daily. (Patient not taking: Reported on 06/02/2021)  ? [DISCONTINUED] clomiPHENE (CLOMID) 50 MG tablet Take 1 tablet by mouth daily. (Patient not taking:  Reported on 06/02/2021)  ? [DISCONTINUED] montelukast (SINGULAIR) 10 MG tablet Take 1 tablet by mouth daily. (Patient not taking: Reported on 06/02/2021)  ? ?No facility-administered medications prior to visit.  ? ?Reviewed past medical and social history.  ? ?ROS per HPI above ? ? ?   ?Objective:  ?BP 130/84 (BP Location: Left Arm, Patient Position: Sitting, Cuff Size: Large)   Pulse 82   Temp (!) 97 ?F (36.1 ?C) (Temporal)   Ht 5\' 10"  (1.778 m)   Wt 248 lb (112.5 kg)   SpO2 98%   BMI 35.58 kg/m?  ? ?  ? ?Physical Exam ?Psychiatric:     ?   Attention and Perception: Attention and perception normal.     ?   Mood and Affect: Mood is anxious.     ?   Speech: Speech normal.     ?   Behavior: Behavior is cooperative.     ?   Thought Content: Thought content normal.     ?   Cognition and Memory: Cognition and memory normal.  ?  ?No results found for any visits on 06/02/21. ?   ?Assessment & Plan:  ?  ?Problem List Items Addressed This Visit   ? ?  ? Other  ? GAD (generalized anxiety disorder) - Primary  ?  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 ?Ongoing sessions with therapist once a month. ?  Denies any SI/HI or hallucinations. ? ?Increase effexor to 75mg  daily ?Maintain sessions with therapist and possibly meet every 2weeks ?F/up in 43month ?  ?  ? Relevant Medications  ? venlafaxine XR (EFFEXOR XR) 75 MG 24 hr capsule  ? ?Return in about 4 weeks (around 06/30/2021) for CPE and anxiety f/up). ? ?  ? ?Wilfred Lacy, NP ? ? ?

## 2021-06-02 NOTE — Assessment & Plan Note (Signed)
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 ?Ongoing sessions with therapist once a month. ?Denies any SI/HI or hallucinations. ? ?Increase effexor to 75mg  daily ?Maintain sessions with therapist and possibly meet every 2weeks ?F/up in 25month ?

## 2021-07-21 ENCOUNTER — Encounter: Payer: Self-pay | Admitting: Nurse Practitioner

## 2021-07-21 ENCOUNTER — Ambulatory Visit (INDEPENDENT_AMBULATORY_CARE_PROVIDER_SITE_OTHER): Payer: 59 | Admitting: Nurse Practitioner

## 2021-07-21 VITALS — BP 122/86 | HR 63 | Temp 97.8°F | Ht 70.0 in | Wt 246.2 lb

## 2021-07-21 DIAGNOSIS — Z0000 Encounter for general adult medical examination without abnormal findings: Secondary | ICD-10-CM | POA: Diagnosis not present

## 2021-07-21 DIAGNOSIS — E785 Hyperlipidemia, unspecified: Secondary | ICD-10-CM

## 2021-07-21 DIAGNOSIS — R350 Frequency of micturition: Secondary | ICD-10-CM

## 2021-07-21 DIAGNOSIS — F411 Generalized anxiety disorder: Secondary | ICD-10-CM | POA: Diagnosis not present

## 2021-07-21 DIAGNOSIS — Z0001 Encounter for general adult medical examination with abnormal findings: Secondary | ICD-10-CM

## 2021-07-21 LAB — PSA: PSA: 0.12 ng/mL (ref 0.10–4.00)

## 2021-07-21 NOTE — Assessment & Plan Note (Signed)
Improved ?No adverse effects ?Maintain med dose ?

## 2021-07-21 NOTE — Assessment & Plan Note (Addendum)
Repeat lipid panel: persistent elevated LDL, TG and TC Encourage heart healthy diet and exercise at this time. ASCVD risk at <5% Repeat in 6-26months

## 2021-07-21 NOTE — Progress Notes (Signed)
Complete physical exam  Patient: Shane Munoz   DOB: Sep 06, 1980   41 y.o. Adult  MRN: [id number] Visit Date: 07/25/2021  Subjective:    Chief Complaint  Patient presents with   Annual Exam    CPE Anxiety F/u, states the med increase has been working well for him. Pt fasting. No concerns today    Shane Munoz is a 41 y.o. adult who presents today for a complete physical exam. Shane Munoz reports consuming a low fat diet.  Walking daily 30-41mins per day  Shane Munoz generally feels well. Shane Munoz reports sleeping well. Shane Munoz does not have additional problems to discuss today.  Vision:Yes Dental:Yes STD Screen:No PSA:Yes Wt Readings from Last 3 Encounters:  07/21/21 246 lb 3.2 oz (111.7 kg)  06/02/21 248 lb (112.5 kg)  12/17/20 235 lb 9.6 oz (106.9 kg)    Most recent fall risk assessment:    07/21/2021    1:16 PM  [place] in the past year? 0  Number falls in past yr: 0  Injury with Fall? 0   Most recent depression screenings:    07/21/2021    1:19 PM 06/02/2021   10:51 AM  PHQ 2/9 Scores  PHQ - 2 Score 0 2  PHQ- 9 Score  4   Urinary Frequency  This is a chronic problem. The current episode started more than 1 year ago. The problem occurs every urination. The problem has been unchanged. The pain is at a severity of 0/10. The patient is experiencing no pain. There has been no fever. Shane Munoz is Sexually active. There is No history of pyelonephritis. Associated symptoms include frequency. Pertinent negatives include no chills, discharge, flank pain, hematuria, hesitancy, nausea, sweats, urgency or vomiting. Associated symptoms comments: Nocturia once a night. Drinks about 8-16oz of water per day. Shane Munoz has tried nothing for the symptoms. There is no history of catheterization, kidney stones, recurrent UTIs, a single kidney, urinary stasis or a urological procedure.   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   Hyperlipidemia    Laryngopharyngeal reflux (LPR) 12/20/2019   PTSD (post-traumatic stress disorder)    Substance abuse ([place])    9 yrs ago/ "all substances"   Past Surgical History:  Procedure Laterality Date   MIDDLE EAR SURGERY     tube inside   Social History   Socioeconomic History   Marital status: Married    Spouse name: Not on file   Number of children: Not on file   Years of education: Not on file   Highest education level: Not on file  Occupational History   Not on file  Tobacco Use   Smoking status: Never   Smokeless tobacco: Never  Vaping Use   Vaping Use: Never used  Substance and Sexual Activity   Alcohol use: Yes    Comment: social   Drug use: Not Currently    Comment: clean for 9 years   Sexual activity: Yes  Other Topics Concern   Not on file  Social History Narrative   Not on file   Social Determinants of Health   Financial Resource Strain: Not on file  Food Insecurity: Not on file  Transportation Needs: Not on file  Physical Activity: Not on file  Stress: Not on file  Social Connections: Not on file  Intimate Partner Violence: Not on file   Family Status  Relation Name Status   Mother  Alive   Father  Deceased   MGM  Deceased   MGF  Deceased   Neg Hx  (Not Specified)   Family History  Problem Relation Age of Onset   Heart disease Father 34   Diabetes Father    Stroke Maternal Grandmother 80   Heart disease Maternal Grandfather 80   Colon cancer Neg Hx    Esophageal cancer Neg Hx    Liver cancer Neg Hx    Pancreatic cancer Neg Hx    Rectal cancer Neg Hx    Allergies  Allergen Reactions   Metoprolol Palpitations    Patient Care Team: Shane Munoz, Shane Brooke, NP as PCP - General  (Internal Medicine) Elouise Munroe, MD as PCP - Cardiology (Cardiology)   Medications: Outpatient Medications Prior to Visit  Medication Sig   albuterol (VENTOLIN HFA) 108 (90 Base) MCG/ACT inhaler INHALE ONE TO TWO PUFFS BY MOUTH EVERY 4 TO 6 HOURS AS NEEDED FOR COUGH AND FOR WHEEZING   azelastine (ASTELIN) 0.1 % nasal spray SPRAY ONE TO TWO SPRAYS IN EACH NOSTRIL TWICE DAILY   EPINEPHrine 0.3 mg/0.3 mL IJ SOAJ injection See admin instructions.   levocetirizine (XYZAL) 5 MG tablet Take 5 mg by mouth every evening.   montelukast (SINGULAIR) 10 MG tablet Take 10 mg by mouth at bedtime.   valACYclovir (VALTREX) 1000 MG tablet TAKE 2 TABLETS(2000 MG) BY MOUTH TWICE DAILY FOR 1 DAY   venlafaxine XR (EFFEXOR XR) 75 MG 24 hr capsule Take 1 capsule (75 mg total) by mouth daily with breakfast.   [DISCONTINUED] diclofenac Sodium (VOLTAREN) 1 % GEL Apply 2 g topically in the morning, at noon, and at bedtime.   [DISCONTINUED] tizanidine (ZANAFLEX) 2 MG capsule Take 1-2 capsules (2-4 mg total) by mouth at bedtime as needed for muscle spasms. (Patient not taking: Reported on 07/21/2021)   No facility-administered medications prior to visit.    Review of Systems  Constitutional:  Negative for chills and fever.  HENT:  Negative for congestion and sore throat.   Eyes:        Negative for visual changes  Respiratory:  Negative for cough and shortness of breath.   Cardiovascular:  Negative for chest pain, palpitations and leg swelling.  Gastrointestinal:  Negative for blood in stool, constipation, diarrhea, nausea and vomiting.  Genitourinary:  Positive for frequency. Negative for decreased urine volume, difficulty urinating, dyspareunia, dysuria, enuresis, flank pain, genital sores, hematuria, hesitancy, penile pain, scrotal swelling, testicular pain and urgency.  Musculoskeletal:  Negative for myalgias.  Skin: Negative.  Negative for rash.  Neurological:  Negative for dizziness and headaches.   Hematological:  Does not bruise/bleed easily.  Psychiatric/Behavioral:  Negative for suicidal ideas. The patient is not nervous/anxious.    Last CBC Lab Results  Component Value Date   WBC 5.5 05/08/2020   HGB 16.3 05/08/2020   HCT 47.6 05/08/2020   MCV 85.9 05/08/2020   MCH 29.0 08/21/2019   RDW 13.4 05/08/2020   PLT 212.0 05/08/2020      Objective:  BP 122/86 (BP Location: Right Arm, Patient Position: Sitting, Cuff Size: Normal)   Pulse 63   Temp 97.8 F (36.6 C) (Temporal)   Ht 5\' 10"  (1.778 m)   Wt 246 lb 3.2 oz (111.7 kg)   SpO2 98%  BMI 35.33 kg/m     BP Readings from Last 3 Encounters:  07/21/21 122/86  06/02/21 130/84  12/17/20 104/70   Wt Readings from Last 3 Encounters:  07/21/21 246 lb 3.2 oz (111.7 kg)  06/02/21 248 lb (112.5 kg)  12/17/20 235 lb 9.6 oz (106.9 kg)   Physical Exam Vitals reviewed.  Constitutional:      General: Shane Munoz is not in acute distress. HENT:     Right Ear: Hearing, ear canal and external ear normal. No drainage, swelling or tenderness. No middle ear effusion. There is no impacted cerumen. No mastoid tenderness. Tympanic membrane is scarred. Tympanic membrane is not injected, perforated or erythematous.     Left Ear: Hearing, ear canal and external ear normal. No drainage, swelling or tenderness.  No middle ear effusion. There is no impacted cerumen. No mastoid tenderness. Tympanic membrane is scarred. Tympanic membrane is not injected, perforated or erythematous.     Nose: Nose normal.  Eyes:     General: No scleral icterus.    Extraocular Movements: Extraocular movements intact.     Conjunctiva/sclera: Conjunctivae normal.  Cardiovascular:     Rate and Rhythm: Normal rate and regular rhythm.     Pulses: Normal pulses.     Heart sounds: Normal heart sounds.  Pulmonary:     Effort: Pulmonary effort is normal. No respiratory distress.     Breath sounds: Normal breath sounds.  Abdominal:     General: There is no  distension.     Palpations: Abdomen is soft.  Musculoskeletal:        General: Normal range of motion.     Cervical back: Normal range of motion and neck supple.     Right lower leg: No edema.     Left lower leg: No edema.  Lymphadenopathy:     Cervical: No cervical adenopathy.  Skin:    General: Skin is warm and dry.  Neurological:     Mental Status: Shane Munoz is alert and oriented to person, place, and time.  Psychiatric:        Mood and Affect: Mood normal.        Behavior: Behavior normal.        Thought Content: Thought content normal.    Results for orders placed or performed in visit on 07/21/21  Comprehensive metabolic panel  Result Value Ref Range   Sodium 140 135 - 145 mEq/L   Potassium 3.9 3.5 - 5.1 mEq/L   Chloride 103 96 - 112 mEq/L   CO2 26 19 - 32 mEq/L   Glucose, Bld 101 (H) 70 - 99 mg/dL   BUN 12 6 - 23 mg/dL   Creatinine, Ser 0.96 0.40 - 1.50 mg/dL   Total Bilirubin 0.5 0.2 - 1.2 mg/dL   Alkaline Phosphatase 80 39 - 117 U/L   AST 14 0 - 37 U/L   ALT 26 0 - 53 U/L   Total Protein 7.0 6.0 - 8.3 g/dL   Albumin 4.5 3.5 - 5.2 g/dL   GFR 97.30 >60.00 mL/min   Calcium 9.3 8.4 - 10.5 mg/dL  Lipid panel  Result Value Ref Range   Cholesterol 216 (H) 0 - 200 mg/dL   Triglycerides 195.0 (H) 0.0 - 149.0 mg/dL   HDL 49.50 >39.00 mg/dL   VLDL 39.0 0.0 - 40.0 mg/dL   LDL Cholesterol 127 (H) 0 - 99 mg/dL   Total CHOL/HDL Ratio 4    NonHDL 166.42   PSA  Result Value Ref Range  PSA 0.12 0.10 - 4.00 ng/mL      Assessment & Plan:    Routine Health Maintenance and Physical Exam  Immunization History  Administered Date(s) Administered   Influenza Inj Mdck Quad Pf 12/24/2016   Influenza Split 12/24/2016, 12/07/2017, 11/22/2018, 12/05/2019   Influenza, High Dose Seasonal PF 01/02/2020, 05/16/2021   Influenza,inj,Quad PF,6+ Mos 12/07/2017, 11/22/2018, 12/05/2019, 11/13/2020   PFIZER(Purple Top)SARS-COV-2 Vaccination 05/29/2019, 06/26/2019, 12/24/2019,  05/16/2021   Tdap 07/12/2017   Health Maintenance  Topic Date Due   Hepatitis C Screening  Never done   COVID-19 Vaccine (5 - Booster for [place] series) 08/06/2021 (Originally 07/11/2021)   INFLUENZA VACCINE  10/07/2021   TETANUS/TDAP  07/13/2027   HIV Screening  Completed   HPV VACCINES  Aged Out   PAP SMEAR-Modifier  Discontinued   Discussed health benefits of physical activity, and encouraged Bobbyjoe Leetch to engage in regular exercise appropriate for Roscoe Vecchio's age and condition.  Problem List Items Addressed This Visit       Other   Dyslipidemia    Repeat lipid panel       Relevant Orders   Lipid panel (Completed)   GAD (generalized anxiety disorder)    Improved No adverse effects Maintain med dose       Other Visit Diagnoses     Encounter for preventative adult health care exam with abnormal findings    -  Primary   Relevant Orders   Comprehensive metabolic panel (Completed)   Urinary frequency       Relevant Orders   PSA (Completed)      Return in about 1 year (around 07/22/2022) for CPE (fasting).     Wilfred Lacy, NP

## 2021-07-21 NOTE — Patient Instructions (Signed)
Go to lab ?Continue daily exercise and heart healthy diet ?Will complete form after review of lab results. ? ?Preventive Care 71-43 Years Old ?Preventive care refers to lifestyle choices and visits with your health care provider that can promote health and wellness. Preventive care visits are also called wellness exams. ?What can I expect for my preventive care visit? ?Counseling ?During your preventive care visit, your health care provider may ask about your: ?Medical history, including: ?Past medical problems. ?Family medical history. ?Current health, including: ?Emotional well-being. ?Home life and relationship well-being. ?Sexual activity. ?Lifestyle, including: ?Alcohol, nicotine or tobacco, and drug use. ?Access to firearms. ?Diet, exercise, and sleep habits. ?Safety issues such as seatbelt and bike helmet use. ?Sunscreen use. ?Work and work Astronomer. ?Physical exam ?Your health care provider will check your: ?Height and weight. These may be used to calculate your BMI (body mass index). BMI is a measurement that tells if you are at a healthy weight. ?Waist circumference. This measures the distance around your waistline. This measurement also tells if you are at a healthy weight and may help predict your risk of certain diseases, such as type 2 diabetes and high blood pressure. ?Heart rate and blood pressure. ?Body temperature. ?Skin for abnormal spots. ?What immunizations do I need? ? ?Vaccines are usually given at various ages, according to a schedule. Your health care provider will recommend vaccines for you based on your age, medical history, and lifestyle or other factors, such as travel or where you work. ?What tests do I need? ?Screening ?Your health care provider may recommend screening tests for certain conditions. This may include: ?Lipid and cholesterol levels. ?Diabetes screening. This is done by checking your blood sugar (glucose) after you have not eaten for a while (fasting). ?Hepatitis B  test. ?Hepatitis C test. ?HIV (human immunodeficiency virus) test. ?STI (sexually transmitted infection) testing, if you are at risk. ?Lung cancer screening. ?Prostate cancer screening. ?Colorectal cancer screening. ?Talk with your health care provider about your test results, treatment options, and if necessary, the need for more tests. ?Follow these instructions at home: ?Eating and drinking ? ?Eat a diet that includes fresh fruits and vegetables, whole grains, lean protein, and low-fat dairy products. ?Take vitamin and mineral supplements as recommended by your health care provider. ?Do not drink alcohol if your health care provider tells you not to drink. ?If you drink alcohol: ?Limit how much you have to 0-2 drinks a day. ?Know how much alcohol is in your drink. In the [place] one drink equals one 12 oz bottle of beer (355 mL), one 5 oz glass of wine (148 mL), or one 1? oz glass of hard liquor (44 mL). ?Lifestyle ?Brush your teeth every morning and night with fluoride toothpaste. Floss one time each day. ?Exercise for at least 30 minutes 5 or more days each week. ?Do not use any products that contain nicotine or tobacco. These products include cigarettes, chewing tobacco, and vaping devices, such as e-cigarettes. If you need help quitting, ask your health care provider. ?Do not use drugs. ?If you are sexually active, practice safe sex. Use a condom or other form of protection to prevent STIs. ?Take aspirin only as told by your health care provider. Make sure that you understand how much to take and what form to take. Work with your health care provider to find out whether it is safe and beneficial for you to take aspirin daily. ?Find healthy ways to manage stress, such as: ?Meditation, yoga, or listening to music. ?Journaling. ?  Talking to a trusted person. ?Spending time with friends and family. ?Minimize exposure to UV radiation to reduce your risk of skin cancer. ?Safety ?Always wear your seat belt while  driving or riding in a vehicle. ?Do not drive: ?If you have been drinking alcohol. Do not ride with someone who has been drinking. ?When you are tired or distracted. ?While texting. ?If you have been using any mind-altering substances or drugs. ?Wear a helmet and other protective equipment during sports activities. ?If you have firearms in your house, make sure you follow all gun safety procedures. ?What's next? ?Go to your health care provider once a year for an annual wellness visit. ?Ask your health care provider how often you should have your eyes and teeth checked. ?Stay up to date on all vaccines. ?This information is not intended to replace advice given to you by your health care provider. Make sure you discuss any questions you have with your health care provider. ?Document Revised: 08/21/2020 Document Reviewed: 08/21/2020 ?Elsevier Patient Education ? 2023 [employer] Inc[employer] ? ?

## 2021-07-22 LAB — COMPREHENSIVE METABOLIC PANEL
ALT: 26 U/L (ref 0–53)
AST: 14 U/L (ref 0–37)
Albumin: 4.5 g/dL (ref 3.5–5.2)
Alkaline Phosphatase: 80 U/L (ref 39–117)
BUN: 12 mg/dL (ref 6–23)
CO2: 26 mEq/L (ref 19–32)
Calcium: 9.3 mg/dL (ref 8.4–10.5)
Chloride: 103 mEq/L (ref 96–112)
Creatinine, Ser: 0.96 mg/dL (ref 0.40–1.50)
GFR: 97.3 mL/min (ref >60.00)
Glucose, Bld: 101 mg/dL — ABNORMAL HIGH (ref 70–99)
Potassium: 3.9 mEq/L (ref 3.5–5.1)
Sodium: 140 mEq/L (ref 135–145)
Total Bilirubin: 0.5 mg/dL (ref 0.2–1.2)
Total Protein: 7 g/dL (ref 6.0–8.3)

## 2021-07-22 LAB — LIPID PANEL
Cholesterol: 216 mg/dL — ABNORMAL HIGH (ref 0–200)
HDL: 49.5 mg/dL (ref >39.00)
LDL Cholesterol: 127 mg/dL — ABNORMAL HIGH (ref 0–99)
NonHDL: 166.42
Total CHOL/HDL Ratio: 4
Triglycerides: 195 mg/dL — ABNORMAL HIGH (ref 0.0–149.0)
VLDL: 39 mg/dL (ref 0.0–40.0)

## 2021-07-27 ENCOUNTER — Other Ambulatory Visit: Payer: Self-pay | Admitting: Nurse Practitioner

## 2021-07-27 DIAGNOSIS — B001 Herpesviral vesicular dermatitis: Secondary | ICD-10-CM

## 2021-07-28 NOTE — Telephone Encounter (Signed)
Last OV 07/2021 Chart Supports Rx

## 2021-09-30 ENCOUNTER — Encounter: Payer: Self-pay | Admitting: Nurse Practitioner

## 2021-09-30 ENCOUNTER — Ambulatory Visit: Payer: 59 | Admitting: Nurse Practitioner

## 2021-09-30 VITALS — BP 122/82 | HR 61 | Temp 95.3°F | Ht 70.0 in | Wt 244.4 lb

## 2021-09-30 DIAGNOSIS — E785 Hyperlipidemia, unspecified: Secondary | ICD-10-CM

## 2021-09-30 DIAGNOSIS — R739 Hyperglycemia, unspecified: Secondary | ICD-10-CM

## 2021-09-30 DIAGNOSIS — B001 Herpesviral vesicular dermatitis: Secondary | ICD-10-CM | POA: Diagnosis not present

## 2021-09-30 DIAGNOSIS — E291 Testicular hypofunction: Secondary | ICD-10-CM | POA: Diagnosis not present

## 2021-09-30 MED ORDER — VALACYCLOVIR HCL 1 G PO TABS: ORAL_TABLET | ORAL | 1 refills | Status: DC

## 2021-09-30 NOTE — Progress Notes (Signed)
Established Patient Visit  Patient: Shane Munoz   DOB: 25-Oct-1980   41 y.o. Adult  MRN: [id number] Visit Date: 09/30/2021  Subjective:    Chief Complaint  Patient presents with   Acute Visit    Pt has questions about a new medication that he is starting for hormone replacements ; Natesto.  No other concerns    HPI   Reviewed medical, surgical, and social history today  Medications: Outpatient Medications Prior to Visit  Medication Sig   albuterol (VENTOLIN HFA) 108 (90 Base) MCG/ACT inhaler INHALE ONE TO TWO PUFFS BY MOUTH EVERY 4 TO 6 HOURS AS NEEDED FOR COUGH AND FOR WHEEZING   azelastine (ASTELIN) 0.1 % nasal spray SPRAY ONE TO TWO SPRAYS IN EACH NOSTRIL TWICE DAILY   EPINEPHrine 0.3 mg/0.3 mL IJ SOAJ injection See admin instructions.   levocetirizine (XYZAL) 5 MG tablet Take 5 mg by mouth every evening.   montelukast (SINGULAIR) 10 MG tablet Take 10 mg by mouth at bedtime.   Testosterone (NATESTO) 5.5 MG/ACT GEL Apply 1 pump (5.5mg ) in each nostril 3 times daily (33 mg total).  3 dispensers = 30-day supply.   venlafaxine XR (EFFEXOR XR) 75 MG 24 hr capsule Take 1 capsule (75 mg total) by mouth daily with breakfast.   [DISCONTINUED] valACYclovir (VALTREX) 1000 MG tablet TAKE 2 TABLETS(2000 MG) BY MOUTH TWICE DAILY FOR 1 DAY   No facility-administered medications prior to visit.   Reviewed past medical and social history.   ROS per HPI above      Objective:  BP 122/82 (BP Location: Right Arm, Patient Position: Sitting, Cuff Size: Normal)   Pulse 61   Temp (!) 95.3 F (35.2 C) (Temporal)   Ht 5\' 10"  (1.778 m)   Wt 244 lb 6.4 oz (110.9 kg)   SpO2 98%   BMI 35.07 kg/m      Physical Exam Cardiovascular:     Rate and Rhythm: Normal rate.     Pulses: Normal pulses.  Pulmonary:     Effort: Pulmonary effort is normal.  Neurological:     Mental Status: Shane Munoz is alert and oriented to person, place, and time.  Psychiatric:        Mood and  Affect: Mood normal.        Behavior: Behavior normal.        Thought Content: Thought content normal.     No results found for any visits on 09/30/21.    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 This was chosen to minimize risk of testosterone exposure to his wife. Shane Munoz is concerned about possible interaction with effexor and azelastine nasal, possible risk of blood clot, and labs which need to be monitor while using this medication. No current tobacco use, no personal or family hx of DVT/PE or arrhythmia or HTN or DM or PVD or cancer or liver disease or polycythemia We reviewed his previous lab results (05/2021 and 07/2021): PSA, testosterone, TSH, CMP, CBC, and pathology report. We discussed possible side effects of Natesto. Informed Shane Munoz of no known med interaction with azelastine or montelukast or effexor.In the absence of risk factors, his risk of developing a blood clot is not any higher than the average population. Shane Munoz verbalized understanding. I answered all  questions to his satisfaction. Advised to schedule lab appt for repeat cbc, PSa, TSh, lipid panel, hepatic panel in 1-67months.      Relevant Orders   PSA   CBC   Hepatic function panel   TSH   T4, free     Other   Dyslipidemia   Relevant Orders   Lipid panel   Hyperglycemia   Relevant Orders   Hemoglobin A1c   Other Visit Diagnoses     Herpes labialis       Relevant Medications   valACYclovir (VALTREX) 1000 MG tablet     I have spent with this patient regarding history taking, documentation, review of labs and specialty note, formulating plan and discussing treatment options with patient.   Return if symptoms worsen or fail to improve.     Alysia Penna, NP

## 2021-09-30 NOTE — Assessment & Plan Note (Signed)
Shane Munoz was prescribed Natesto (intranasal testosterone)by urologist. This was chosen to minimize risk of testosterone exposure to his wife. Shane Munoz is concerned about possible interaction with effexor and azelastine nasal, possible risk of blood clot, and labs which need to be monitor while using this medication. No current tobacco use, no personal or family hx of DVT/PE or arrhythmia or HTN or DM or PVD or cancer or liver disease or polycythemia We reviewed his previous lab results (05/2021 and 07/2021): PSA, testosterone, TSH, CMP, CBC, and pathology report. We discussed possible side effects of Natesto. Informed Shane Munoz of no known med interaction with azelastine or montelukast or effexor.In the absence of risk factors, his risk of developing a blood clot is not any higher than the average population. Shane Munoz verbalized understanding. I answered all questions to his satisfaction. Advised to schedule lab appt for repeat cbc, PSa, TSh, lipid panel, hepatic panel in 1-77months.

## 2021-09-30 NOTE — Patient Instructions (Signed)
Schedule lab appt for fasting labs in 1-20months.

## 2021-12-31 ENCOUNTER — Ambulatory Visit: Payer: 59 | Admitting: Nurse Practitioner

## 2022-01-06 ENCOUNTER — Encounter (INDEPENDENT_AMBULATORY_CARE_PROVIDER_SITE_OTHER): Payer: Self-pay

## 2022-01-29 ENCOUNTER — Encounter (INDEPENDENT_AMBULATORY_CARE_PROVIDER_SITE_OTHER): Payer: Self-pay

## 2022-02-02 ENCOUNTER — Ambulatory Visit: Payer: 59 | Admitting: Family Medicine

## 2022-02-02 ENCOUNTER — Encounter: Payer: Self-pay | Admitting: Family Medicine

## 2022-02-02 VITALS — BP 138/90 | HR 61 | Temp 97.5°F | Ht 70.0 in | Wt 240.2 lb

## 2022-02-02 DIAGNOSIS — R030 Elevated blood-pressure reading, without diagnosis of hypertension: Secondary | ICD-10-CM

## 2022-02-02 DIAGNOSIS — R739 Hyperglycemia, unspecified: Secondary | ICD-10-CM | POA: Diagnosis not present

## 2022-02-02 DIAGNOSIS — E291 Testicular hypofunction: Secondary | ICD-10-CM

## 2022-02-02 DIAGNOSIS — E785 Hyperlipidemia, unspecified: Secondary | ICD-10-CM

## 2022-02-02 DIAGNOSIS — J069 Acute upper respiratory infection, unspecified: Secondary | ICD-10-CM | POA: Diagnosis not present

## 2022-02-02 LAB — CBC
HCT: 46.3 % (ref 39.0–52.0)
Hemoglobin: 16 g/dL (ref 13.0–17.0)
MCHC: 34.5 g/dL (ref 30.0–36.0)
MCV: 83.5 fl (ref 78.0–100.0)
Platelets: 302 10*3/uL (ref 150.0–400.0)
RBC: 5.55 Mil/uL (ref 4.22–5.81)
RDW: 13 % (ref 11.5–15.5)
WBC: 8.4 10*3/uL (ref 4.0–10.5)

## 2022-02-02 LAB — LIPID PANEL
Cholesterol: 229 mg/dL — ABNORMAL HIGH (ref 0–200)
HDL: 43.5 mg/dL (ref >39.00)
NonHDL: 185.24
Total CHOL/HDL Ratio: 5
Triglycerides: 226 mg/dL — ABNORMAL HIGH (ref 0.0–149.0)
VLDL: 45.2 mg/dL — ABNORMAL HIGH (ref 0.0–40.0)

## 2022-02-02 LAB — HEPATIC FUNCTION PANEL
ALT: 27 U/L (ref 0–53)
AST: 15 U/L (ref 0–37)
Albumin: 4.6 g/dL (ref 3.5–5.2)
Alkaline Phosphatase: 102 U/L (ref 39–117)
Bilirubin, Direct: 0.1 mg/dL (ref 0.0–0.3)
Total Bilirubin: 0.5 mg/dL (ref 0.2–1.2)
Total Protein: 7.5 g/dL (ref 6.0–8.3)

## 2022-02-02 LAB — LDL CHOLESTEROL, DIRECT: Direct LDL: 158 mg/dL

## 2022-02-02 LAB — TSH: TSH: 2.19 u[IU]/mL (ref 0.35–5.50)

## 2022-02-02 LAB — HEMOGLOBIN A1C: Hgb A1c MFr Bld: 5.9 % (ref 4.6–6.5)

## 2022-02-02 LAB — PSA: PSA: 0.2 ng/mL (ref 0.10–4.00)

## 2022-02-02 LAB — T4, FREE: Free T4: 1.36 ng/dL (ref 0.60–1.60)
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
## 2022-02-02 NOTE — Addendum Note (Signed)
Addended by: Loyola Mast on: 02/02/2022 12:51 PM   Modules accepted: Level of Service

## 2022-02-06 ENCOUNTER — Other Ambulatory Visit: Payer: Self-pay | Admitting: Nurse Practitioner

## 2022-02-06 DIAGNOSIS — E781 Pure hyperglyceridemia: Secondary | ICD-10-CM

## 2022-02-06 DIAGNOSIS — R739 Hyperglycemia, unspecified: Secondary | ICD-10-CM

## 2022-02-06 MED ORDER — FENOFIBRATE 145 MG PO TABS: 145.0000 mg | ORAL_TABLET | Freq: Every day | ORAL | 1 refills | Status: DC

## 2022-02-25 ENCOUNTER — Ambulatory Visit: Payer: 59 | Admitting: Nurse Practitioner

## 2022-02-25 ENCOUNTER — Telehealth: Payer: Self-pay | Admitting: Nurse Practitioner

## 2022-02-25 NOTE — Telephone Encounter (Signed)
Pt was late for 12/20 OV with [facility]. Had to be rescheduled. This is his first, letter sent

## 2022-02-25 NOTE — Telephone Encounter (Signed)
1st late arrival/no show, fee waived, letter sent , pt rescheduled for 03/18/2022

## 2022-03-18 ENCOUNTER — Encounter: Payer: Self-pay | Admitting: Nurse Practitioner

## 2022-03-18 ENCOUNTER — Ambulatory Visit (INDEPENDENT_AMBULATORY_CARE_PROVIDER_SITE_OTHER): Payer: Managed Care, Other (non HMO) | Admitting: Nurse Practitioner

## 2022-03-18 VITALS — BP 130/80 | HR 63 | Temp 98.1°F | Resp 18 | Ht 70.0 in | Wt 245.8 lb

## 2022-03-18 DIAGNOSIS — I10 Essential (primary) hypertension: Secondary | ICD-10-CM | POA: Insufficient documentation

## 2022-03-18 DIAGNOSIS — B001 Herpesviral vesicular dermatitis: Secondary | ICD-10-CM

## 2022-03-18 DIAGNOSIS — R030 Elevated blood-pressure reading, without diagnosis of hypertension: Secondary | ICD-10-CM | POA: Diagnosis not present

## 2022-03-18 MED ORDER — VALACYCLOVIR HCL 1 G PO TABS: ORAL_TABLET | ORAL | 2 refills | Status: DC

## 2022-03-18 NOTE — Progress Notes (Signed)
Established Patient Visit  Patient: Shane Munoz   DOB: [birth year]   42 y.o. Adult  MRN: [id number] Visit Date: 03/18/2022  Subjective:    Chief Complaint  Patient presents with  . Hyperlipidemia   HPI Elevated blood pressure reading without diagnosis of hypertension Home BP reading since 01/2022: 130s-140s/80s-90s Admits to poor diet and lack of exercise since COVID and RSV infection 79months ago.  BP Readings from Last 3 Encounters:  03/18/22 130/80  02/02/22 (!) 138/90  09/30/21 122/82    Advised about importance of DASH diet and daily exercise F/up in 20month  Wt Readings from Last 3 Encounters:  03/18/22 245 lb 12.8 oz (111.5 kg)  02/02/22 240 lb 3.2 oz (109 kg)  09/30/21 244 lb 6.4 oz (110.9 kg)     Reviewed medical, surgical, and social history today  Medications: Outpatient Medications Prior to Visit  Medication Sig  . albuterol (VENTOLIN HFA) 108 (90 Base) MCG/ACT inhaler INHALE ONE TO TWO PUFFS BY MOUTH EVERY 4 TO 6 HOURS AS NEEDED FOR COUGH AND FOR WHEEZING  . azelastine (ASTELIN) 0.1 % nasal spray SPRAY ONE TO TWO SPRAYS IN EACH NOSTRIL TWICE DAILY  . EPINEPHrine 0.3 mg/0.3 mL IJ SOAJ injection See admin instructions.  . fenofibrate (TRICOR) 145 MG tablet Take 1 tablet (145 mg total) by mouth daily.  Marland Kitchen levocetirizine (XYZAL) 5 MG tablet Take 5 mg by mouth every evening.  . montelukast (SINGULAIR) 10 MG tablet Take 10 mg by mouth at bedtime.  . Testosterone (NATESTO) 5.5 MG/ACT GEL Apply 1 pump (5.5mg ) in each nostril 3 times daily (33 mg total).  3 dispensers = 30-day supply.  . venlafaxine XR (EFFEXOR XR) 75 MG 24 hr capsule Take 1 capsule (75 mg total) by mouth daily with breakfast.  . [DISCONTINUED] valACYclovir (VALTREX) 1000 MG tablet TAKE 2 TABLETS(2000 MG) BY MOUTH TWICE DAILY FOR 1 DAY  . [DISCONTINUED] benzonatate (TESSALON) 100 MG capsule  (Patient not taking: Reported on 03/18/2022)   No facility-administered medications prior to visit.    Reviewed past medical and social history.   ROS per HPI above      Objective:  BP 130/80 (BP Location: Left Arm, Patient Position: Sitting)   Pulse 63   Temp 98.1 F (36.7 C) (Oral)   Resp 18   Ht 5\' 10"  (1.778 m)   Wt 245 lb 12.8 oz (111.5 kg)   SpO2 96%   BMI 35.27 kg/m      Physical Exam Cardiovascular:     Rate and Rhythm: Normal rate and regular rhythm.     Pulses: Normal pulses.     Heart sounds: Normal heart sounds.  Pulmonary:     Effort: Pulmonary effort is normal.  Musculoskeletal:     Right lower leg: No edema.     Left lower leg: No edema.  Neurological:     Mental Status: Hristopher Missildine is alert and oriented to person, place, and time.    No results found for any visits on 03/18/22.    Assessment & Plan:    Problem List Items Addressed This Visit       Digestive   Herpes labialis   Relevant Medications   valACYclovir (VALTREX) 1000 MG tablet     Other   Elevated blood pressure reading without diagnosis of hypertension - Primary    Home BP reading since 01/2022: 130s-140s/80s-90s Admits to poor diet and lack of exercise since  COVID and RSV infection 26months ago.  BP Readings from Last 3 Encounters:  03/18/22 130/80  02/02/22 (!) 138/90  09/30/21 122/82    Advised about importance of DASH diet and daily exercise F/up in 47month      Return in about 6 weeks (around 04/27/2022) for HTN.     Wilfred Lacy, NP

## 2022-03-18 NOTE — Patient Instructions (Addendum)
Go to lab for repeat labs (fasting 8hrs prior) Completed lab before upcoming f/up appt in February. Maintain heart healthy diet and daily exercise  Cooking With Less Salt Cooking with less salt is one way to reduce the amount of sodium you get from food. Sodium is one of the elements that make up salt. It is found naturally in foods and is also added to certain foods. Depending on your condition and overall health, your health care provider or dietitian may recommend that you reduce your sodium intake. Most people should have less than 2,300 milligrams (mg) of sodium each day. If you have high blood pressure (hypertension), you may need to limit your sodium to 1,500 mg each day. Follow the tips below to help reduce your sodium intake. What are tips for eating less sodium? Reading food labels  Check the food label before buying or using packaged ingredients. Always check the label for the serving size and sodium content. Look for products with no more than 140 mg of sodium in one serving. Check the % Daily Value column to see what percent of the daily recommended amount of sodium is provided in one serving of the product. Foods with 5% or less in this column are considered low in sodium. Foods with 20% or higher are considered high in sodium. Do not choose foods with salt as one of the first three ingredients on the ingredients list. If salt is one of the first three ingredients, it usually means the item is high in sodium. Shopping Buy sodium-free or low-sodium products. Look for the following words on food labels: Low-sodium. Sodium-free. Reduced-sodium. No salt added. Unsalted. Always check the sodium content even if foods are labeled as low-sodium or no salt added. Buy fresh foods. Cooking Use herbs, seasonings without salt, and spices as substitutes for salt. Use sodium-free baking soda when baking. Grill, braise, or roast foods to add flavor with less salt. Avoid adding salt to pasta,  rice, or hot cereals. Drain and rinse canned vegetables, beans, and meat before use. Avoid adding salt when cooking sweets and desserts. Cook with low-sodium ingredients. What foods are high in sodium? Vegetables Regular canned vegetables (not low-sodium or reduced-sodium). Sauerkraut, pickled vegetables, and relishes. Olives. [place] fries. Onion rings. Regular canned tomato sauce and paste. Regular tomato and vegetable juice. Frozen vegetables in sauces. Grains Instant hot cereals. Bread stuffing, pancake, and biscuit mixes. Croutons. Seasoned rice or pasta mixes. Noodle soup cups. Boxed or frozen macaroni and cheese. Regular salted crackers. Self-rising flour. Rolls. Bagels. Flour tortillas and wraps. Meats and other proteins Meat or fish that is salted, canned, smoked, cured, spiced, or pickled. This includes bacon, ham, sausages, hot dogs, corned beef, chipped beef, meat loaves, salt pork, jerky, pickled herring, anchovies, regular canned tuna, and sardines. Salted nuts. Dairy Processed cheese and cheese spreads. Cheese curds. Blue cheese. Feta cheese. String cheese. Regular cottage cheese. Buttermilk. Canned milk. The items listed above may not be a complete list of foods high in sodium. Actual amounts of sodium may be different depending on processing. Contact a dietitian for more information. What foods are low in sodium? Fruits Fresh, frozen, or canned fruit with no sauce added. Fruit juice. Vegetables Fresh or frozen vegetables with no sauce added. "No salt added" canned vegetables. "No salt added" tomato sauce and paste. Low-sodium or reduced-sodium tomato and vegetable juice. Grains Noodles, pasta, quinoa, rice. Shredded or puffed wheat or puffed rice. Regular or quick oats (not instant). Low-sodium crackers. Low-sodium bread. Whole-grain bread  and whole-grain pasta. Unsalted popcorn. Meats and other proteins Fresh or frozen whole meats, poultry (not injected with sodium), and fish  with no sauce added. Unsalted nuts. Dried peas, beans, and lentils without added salt. Unsalted canned beans. Eggs. Unsalted nut butters. Low-sodium canned tuna or chicken. Dairy Milk. Soy milk. Yogurt. Low-sodium cheeses, such as Swiss, Monterey Jack, [place], and [employer]. Sherbet or ice cream (keep to  cup per serving). Cream cheese. Fats and oils Unsalted butter or margarine. Other foods Homemade pudding. Sodium-free baking soda and baking powder. Herbs and spices. Low-sodium seasoning mixes. Beverages Coffee and tea. Carbonated beverages. The items listed above may not be a complete list of foods low in sodium. Actual amounts of sodium may be different depending on processing. Contact a dietitian for more information. What are some salt alternatives when cooking? The following are herbs, seasonings, and spices that can be used instead of salt to flavor your food. Herbs should be fresh or dried. Do not choose packaged mixes. Next to the name of the herb, spice, or seasoning are some examples of foods you can pair it with. Herbs [place] leaves - Soups, meat and vegetable dishes, and spaghetti sauce. Basil - [employer], soups, pasta, and fish dishes. Cilantro - Meat, poultry, and vegetable dishes. Chili powder - Marinades and Mexican dishes. Chives - Salad dressings and potato dishes. Cumin - Mexican dishes, couscous, and meat dishes. Dill - Fish dishes, sauces, and salads. Fennel - Meat and vegetable dishes, breads, and cookies. Garlic (do not use garlic salt) - [place] dishes, meat dishes, salad dressings, and sauces. Marjoram - Soups, potato dishes, and meat dishes. Oregano - Pizza and spaghetti sauce. Parsley - Salads, soups, pasta, and meat dishes. Rosemary - [place] dishes, salad dressings, soups, and red meats. Saffron - Fish dishes, pasta, and some poultry dishes. Sage - Stuffings and sauces. Tarragon - Fish and [employer]. Thyme - Stuffing, meat, and fish  dishes. Seasonings Lemon juice - Fish dishes, poultry dishes, vegetables, and salads. Vinegar - Salad dressings, vegetables, and fish dishes. Spices Cinnamon - Sweet dishes, such as cakes, cookies, and puddings. Cloves - Gingerbread, puddings, and marinades for meats. Curry - Vegetable dishes, fish and poultry dishes, and stir-fry dishes. Ginger - Vegetable dishes, fish dishes, and stir-fry dishes. Nutmeg - Pasta, vegetables, poultry, fish dishes, and custard. Summary Cooking with less salt is one way to reduce the amount of sodium that you get from food. Buy sodium-free or low-sodium products. Check the food label before using or buying packaged ingredients. Use herbs, seasonings without salt, and spices as substitutes for salt in foods. This information is not intended to replace advice given to you by your health care provider. Make sure you discuss any questions you have with your health care provider. Document Revised: 02/15/2019 Document Reviewed: 02/15/2019 Elsevier Patient Education  [place].

## 2022-03-18 NOTE — Assessment & Plan Note (Addendum)
Home BP reading since 01/2022: 130s-140s/80s-90s Admits to poor diet and lack of exercise since COVID and RSV infection 79months ago.  BP Readings from Last 3 Encounters:  03/18/22 130/80  02/02/22 (!) 138/90  09/30/21 122/82    Advised about importance of DASH diet and daily exercise F/up in 62month

## 2022-04-22 ENCOUNTER — Other Ambulatory Visit (INDEPENDENT_AMBULATORY_CARE_PROVIDER_SITE_OTHER): Payer: Managed Care, Other (non HMO)

## 2022-04-22 DIAGNOSIS — E781 Pure hyperglyceridemia: Secondary | ICD-10-CM

## 2022-04-22 DIAGNOSIS — R739 Hyperglycemia, unspecified: Secondary | ICD-10-CM | POA: Diagnosis not present

## 2022-04-22 LAB — LIPID PANEL
Cholesterol: 216 mg/dL — ABNORMAL HIGH (ref 0–200)
HDL: 49.5 mg/dL (ref >39.00)
LDL Cholesterol: 131 mg/dL — ABNORMAL HIGH (ref 0–99)
NonHDL: 166.64
Total CHOL/HDL Ratio: 4
Triglycerides: 176 mg/dL — ABNORMAL HIGH (ref 0.0–149.0)
VLDL: 35.2 mg/dL (ref 0.0–40.0)

## 2022-04-22 LAB — HEMOGLOBIN A1C: Hgb A1c MFr Bld: 5.7 % (ref 4.6–6.5)

## 2022-04-22 NOTE — Progress Notes (Signed)
Abnormal: Improving lipid panel but still not at goal. Maintain fenofibrate dose and heart healthy diet. Improved hgbA1c 5.9 to 5.7%

## 2022-04-23 ENCOUNTER — Encounter: Payer: Self-pay | Admitting: Nurse Practitioner

## 2022-04-23 ENCOUNTER — Ambulatory Visit (INDEPENDENT_AMBULATORY_CARE_PROVIDER_SITE_OTHER): Payer: Managed Care, Other (non HMO) | Admitting: Nurse Practitioner

## 2022-04-23 VITALS — BP 128/90 | HR 97 | Temp 98.7°F | Resp 99 | Ht 70.0 in | Wt 250.0 lb

## 2022-04-23 DIAGNOSIS — I10 Essential (primary) hypertension: Secondary | ICD-10-CM

## 2022-04-23 MED ORDER — AMLODIPINE BESYLATE 2.5 MG PO TABS: 2.5000 mg | ORAL_TABLET | Freq: Every day | ORAL | 1 refills | Status: DC

## 2022-04-23 NOTE — Progress Notes (Signed)
Established Patient Visit  Patient: Shane Munoz   DOB: 1980-12-06   42 y.o. Adult  MRN: [id number] Visit Date: 04/23/2022  Subjective:    Chief Complaint  Patient presents with   Medical Management of Chronic Issues    HTN- Checking bp at home- 133/90 yesterday   HPI HTN (hypertension), benign Home BP reading 130s-140s/90s Reports he has maintain DASH diet BP Readings from Last 3 Encounters:  04/23/22 (!) 128/90  03/18/22 130/80  02/02/22 (!) 138/90    Start amlodipine 2.60m in PM Advised to send BP reading via mychart every 2weeks F/up in 133month Wt Readings from Last 3 Encounters:  04/23/22 250 lb (113.4 kg)  03/18/22 245 lb 12.8 oz (111.5 kg)  02/02/22 240 lb 3.2 oz (109 kg)    Reviewed medical, surgical, and social history today  Medications: Outpatient Medications Prior to Visit  Medication Sig   albuterol (VENTOLIN HFA) 108 (90 Base) MCG/ACT inhaler INHALE ONE TO TWO PUFFS BY MOUTH EVERY 4 TO 6 HOURS AS NEEDED FOR COUGH AND FOR WHEEZING   azelastine (ASTELIN) 0.1 % nasal spray SPRAY ONE TO TWO SPRAYS IN EACH NOSTRIL TWICE DAILY   EPINEPHrine 0.3 mg/0.3 mL IJ SOAJ injection See admin instructions.   fenofibrate (TRICOR) 145 MG tablet Take 1 tablet (145 mg total) by mouth daily.   levocetirizine (XYZAL) 5 MG tablet Take 5 mg by mouth every evening.   montelukast (SINGULAIR) 10 MG tablet Take 10 mg by mouth at bedtime.   Testosterone (NATESTO) 5.5 MG/ACT GEL Apply 1 pump (5.28m36min each nostril 3 times daily (33 mg total).  3 dispensers = 30-day supply.   valACYclovir (VALTREX) 1000 MG tablet TAKE 2 TABLETS(2000 MG) BY MOUTH TWICE DAILY FOR 1 DAY   venlafaxine XR (EFFEXOR XR) 75 MG 24 hr capsule Take 1 capsule (75 mg total) by mouth daily with breakfast.   No facility-administered medications prior to visit.   Reviewed past medical and social history.   ROS per HPI above      Objective:  BP (!) 128/90 (BP Location: Right Arm, Patient Position:  Sitting)   Pulse 97   Temp 98.7 F (37.1 C) (Temporal)   Resp (!) 99   Ht 5' 10"$  (1.778 m)   Wt 250 lb (113.4 kg)   SpO2 99%   BMI 35.87 kg/m      Physical Exam Cardiovascular:     Rate and Rhythm: Normal rate and regular rhythm.     Pulses: Normal pulses.     Heart sounds: Normal heart sounds.  Pulmonary:     Effort: Pulmonary effort is normal.     Breath sounds: Normal breath sounds.  Neurological:     Mental Status: NatBernice Zimny alert and oriented to person, place, and time.     No results found for any visits on 04/23/22.    Assessment & Plan:    Problem List Items Addressed This Visit       Cardiovascular and Mediastinum   HTN (hypertension), benign - Primary    Home BP reading 130s-140s/90s Reports he has maintain DASH diet BP Readings from Last 3 Encounters:  04/23/22 (!) 128/90  03/18/22 130/80  02/02/22 (!) 138/90    Start amlodipine 2.28mg58m PM Advised to send BP reading via mychart every 2weeks F/up in 1mon32month Relevant Medications   amLODipine (NORVASC) 2.5 MG tablet  Return in about 3 months (around 07/22/2022) for HTN, hyperlipidemia (fasting).     Wilfred Lacy, NP

## 2022-04-23 NOTE — Assessment & Plan Note (Addendum)
Home BP reading 130s-140s/90s Reports he has maintain DASH diet BP Readings from Last 3 Encounters:  04/23/22 (!) 128/90  03/18/22 130/80  02/02/22 (!) 138/90    Start amlodipine 2.68m in PM Advised to send BP reading via mychart every 2weeks F/up in 138month

## 2022-04-23 NOTE — Patient Instructions (Addendum)
Start amlodipine 2.38m in PM Continue to monitor BP in AM 3x/week Send BP readings via mychart every 2weeks Maintain low salt/low fat/low carb diet Try mediterranean diet Need 1537ms of moderate intensity exercise per week  Mediterranean Diet A Mediterranean diet refers to food and lifestyle choices that are based on the traditions of countries located on the Me[employer] focuses on eating more fruits, vegetables, whole grains, beans, nuts, seeds, and heart-healthy fats, and eating less dairy, meat, eggs, and processed foods with added sugar, salt, and fat. This way of eating has been shown to help prevent certain conditions and improve outcomes for people who have chronic diseases, like kidney disease and heart disease. What are tips for following this plan? Reading food labels Check the serving size of packaged foods. For foods such as rice and pasta, the serving size refers to the amount of cooked product, not dry. Check the total fat in packaged foods. Avoid foods that have saturated fat or trans fats. Check the ingredient list for added sugars, such as corn syrup. Shopping  Buy a variety of foods that offer a balanced diet, including: Fresh fruits and vegetables (produce). Grains, beans, nuts, and seeds. Some of these may be available in unpackaged forms or large amounts (in bulk). Fresh seafood. Poultry and eggs. Low-fat dairy products. Buy whole ingredients instead of prepackaged foods. Buy fresh fruits and vegetables in-season from local farmers markets. Buy plain frozen fruits and vegetables. If you do not have access to quality fresh seafood, buy precooked frozen shrimp or canned fish, such as tuna, salmon, or sardines. Stock your pantry so you always have certain foods on hand, such as olive oil, canned tuna, canned tomatoes, rice, pasta, and beans. Cooking Cook foods with extra-virgin olive oil instead of using butter or other vegetable oils. Have meat as a side  dish, and have vegetables or grains as your main dish. This means having meat in small portions or adding small amounts of meat to foods like pasta or stew. Use beans or vegetables instead of meat in common dishes like chili or lasagna. Experiment with different cooking methods. Try roasting, broiling, steaming, and sauting vegetables. Add frozen vegetables to soups, stews, pasta, or rice. Add nuts or seeds for added healthy fats and plant protein at each meal. You can add these to yogurt, salads, or vegetable dishes. Marinate fish or vegetables using olive oil, lemon juice, garlic, and fresh herbs. Meal planning Plan to eat one vegetarian meal one day each week. Try to work up to two vegetarian meals, if possible. Eat seafood two or more times a week. Have healthy snacks readily available, such as: Vegetable sticks with hummus. Greek yogurt. Fruit and nut trail mix. Eat balanced meals throughout the week. This includes: Fruit: 2-3 servings a day. Vegetables: 4-5 servings a day. Low-fat dairy: 2 servings a day. Fish, poultry, or lean meat: 1 serving a day. Beans and legumes: 2 or more servings a week. Nuts and seeds: 1-2 servings a day. Whole grains: 6-8 servings a day. Extra-virgin olive oil: 3-4 servings a day. Limit red meat and sweets to only a few servings a month. Lifestyle  Cook and eat meals together with your family, when possible. Drink enough fluid to keep your urine pale yellow. Be physically active every day. This includes: Aerobic exercise like running or swimming. Leisure activities like gardening, walking, or housework. Get 7-8 hours of sleep each night. If recommended by your health care provider, drink red wine in moderation. This  means 1 glass a day for nonpregnant women and 2 glasses a day for men. A glass of wine equals 5 oz (150 mL). What foods should I eat? Fruits Apples. Apricots. Avocado. Berries. Bananas. Cherries. Dates. Figs. Grapes. Lemons. Melon.  Oranges. Peaches. Plums. Pomegranate. Vegetables Artichokes. Beets. Broccoli. Cabbage. Carrots. Eggplant. Green beans. Chard. Kale. Spinach. Onions. Leeks. Peas. Squash. Tomatoes. Peppers. Radishes. Grains Whole-grain pasta. Brown rice. Bulgur wheat. Polenta. Couscous. Whole-wheat bread. [place]. Meats and other proteins Beans. Almonds. Sunflower seeds. Pine nuts. Peanuts. [place]. Salmon. Scallops. Shrimp. [place]. Tilapia. Clams. Oysters. Eggs. Poultry without skin. Dairy Low-fat milk. Cheese. Greek yogurt. Fats and oils Extra-virgin olive oil. Avocado oil. Grapeseed oil. Beverages Water. Red wine. Herbal tea. Sweets and desserts Greek yogurt with honey. Baked apples. Poached pears. Trail mix. Seasonings and condiments Basil. Cilantro. Coriander. Cumin. Mint. Parsley. Sage. Rosemary. Tarragon. Garlic. Oregano. Thyme. Pepper. Balsamic vinegar. Tahini. Hummus. Tomato sauce. Olives. Mushrooms. The items listed above may not be a complete list of foods and beverages you can eat. Contact a dietitian for more information. What foods should I limit? This is a list of foods that should be eaten rarely or only on special occasions. Fruits Fruit canned in syrup. Vegetables Deep-fried potatoes (french fries). Grains Prepackaged pasta or rice dishes. Prepackaged cereal with added sugar. Prepackaged snacks with added sugar. Meats and other proteins Beef. Pork. Lamb. Poultry with skin. Hot dogs. [place] Salines. Dairy Ice cream. Sour cream. Whole milk. Fats and oils Butter. Canola oil. Vegetable oil. Beef fat (tallow). Lard. Beverages Juice. Sugar-sweetened soft drinks. Beer. Liquor and spirits. Sweets and desserts Cookies. Cakes. Pies. Candy. Seasonings and condiments Mayonnaise. Pre-made sauces and marinades. The items listed above may not be a complete list of foods and beverages you should limit. Contact a dietitian for more information. Summary The Mediterranean diet includes both food and  lifestyle choices. Eat a variety of fresh fruits and vegetables, beans, nuts, seeds, and whole grains. Limit the amount of red meat and sweets that you eat. If recommended by your health care provider, drink red wine in moderation. This means 1 glass a day for nonpregnant women and 2 glasses a day for men. A glass of wine equals 5 oz (150 mL). This information is not intended to replace advice given to you by your health care provider. Make sure you discuss any questions you have with your health care provider. Document Revised: 03/31/2019 Document Reviewed: 01/26/2019 Elsevier Patient Education  [place].

## 2022-04-25 ENCOUNTER — Encounter: Payer: Self-pay | Admitting: Nurse Practitioner

## 2022-04-25 DIAGNOSIS — F411 Generalized anxiety disorder: Secondary | ICD-10-CM

## 2022-04-25 DIAGNOSIS — I10 Essential (primary) hypertension: Secondary | ICD-10-CM

## 2022-04-29 ENCOUNTER — Ambulatory Visit: Payer: Managed Care, Other (non HMO) | Admitting: Nurse Practitioner

## 2022-04-29 IMAGING — CR DG CHEST 2V
2 series · 2 of 2 positions shown · non-contrast
Comparison: None.

CLINICAL DATA: Shortness of breath

EXAM:
CHEST - 2 VIEW

[w chest pa]
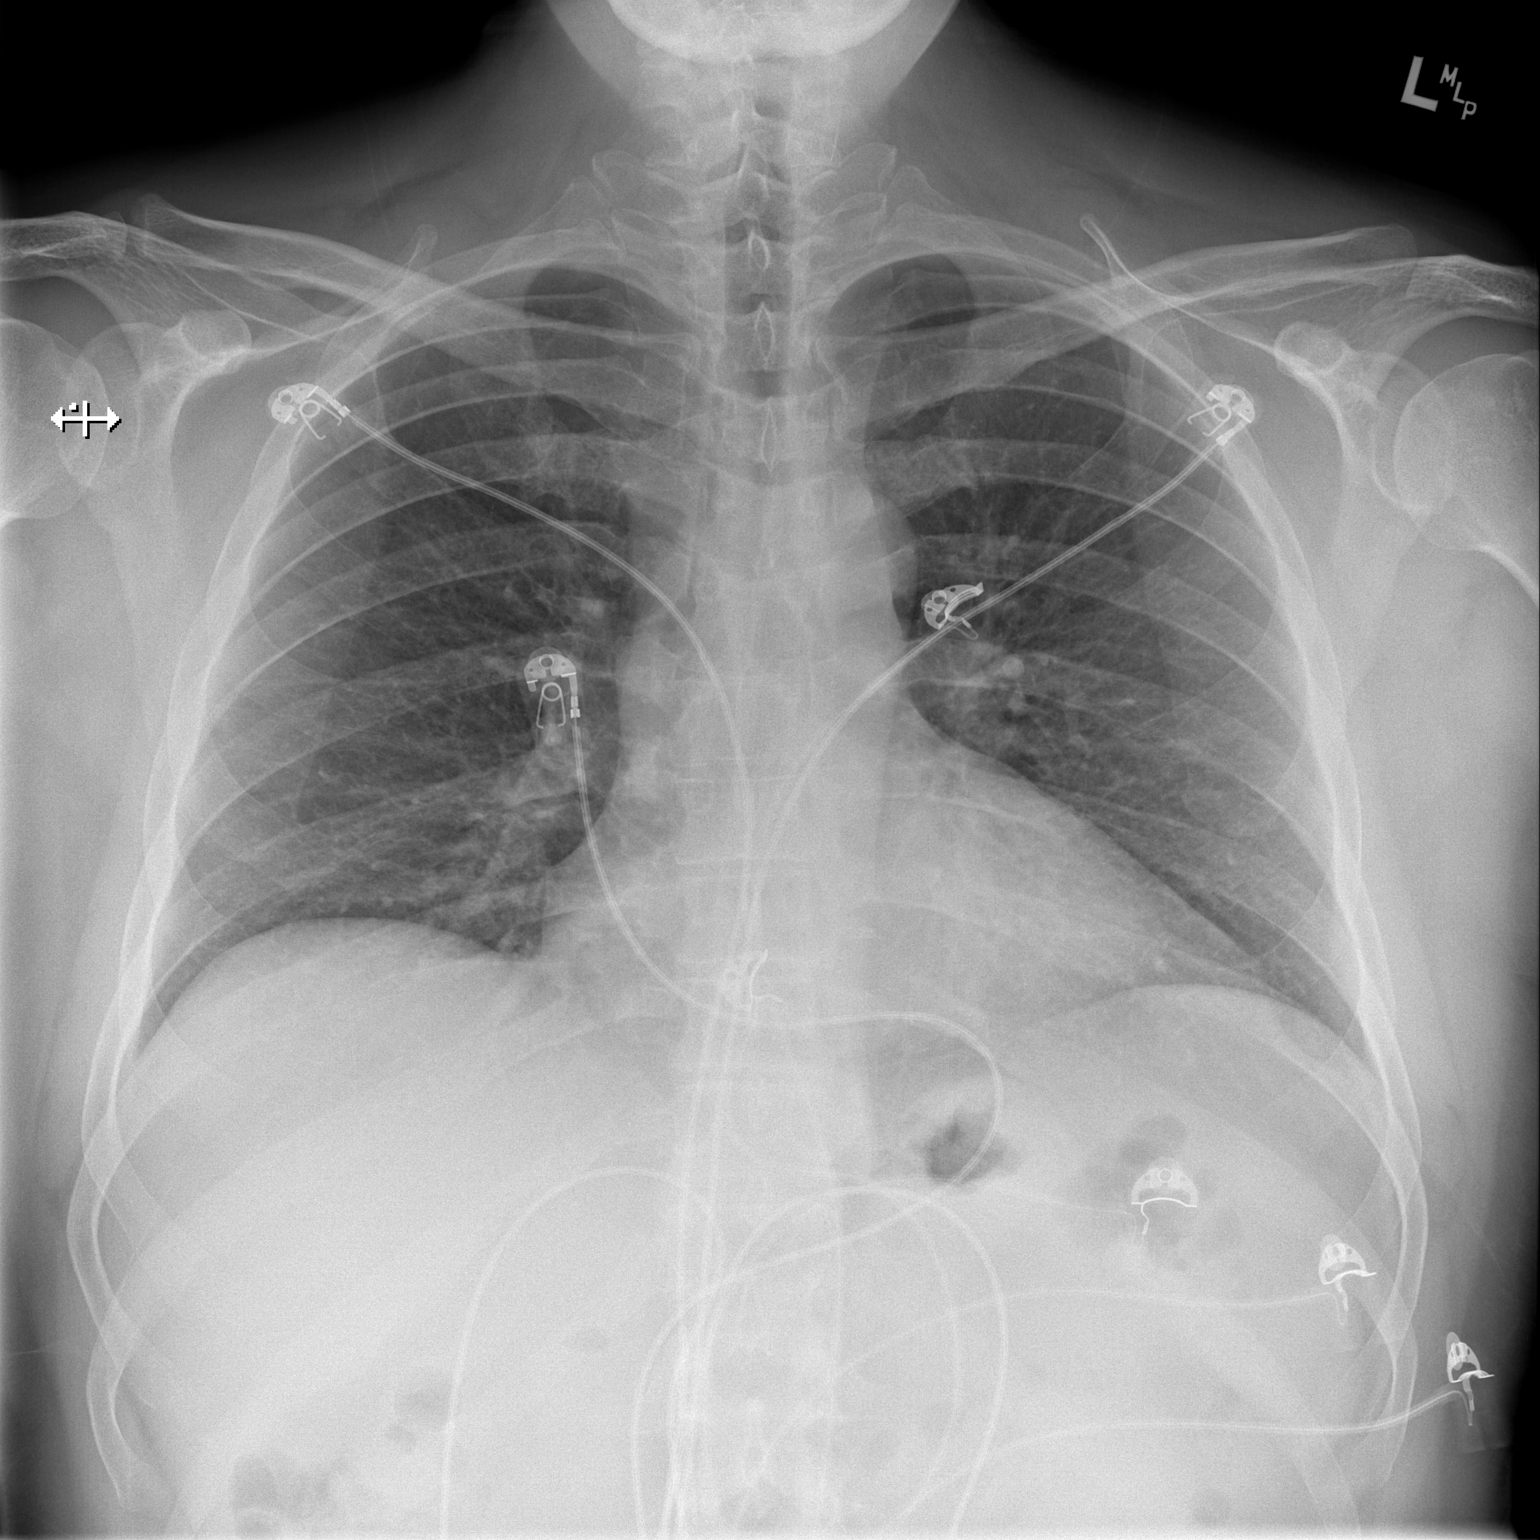

[w chest lat]
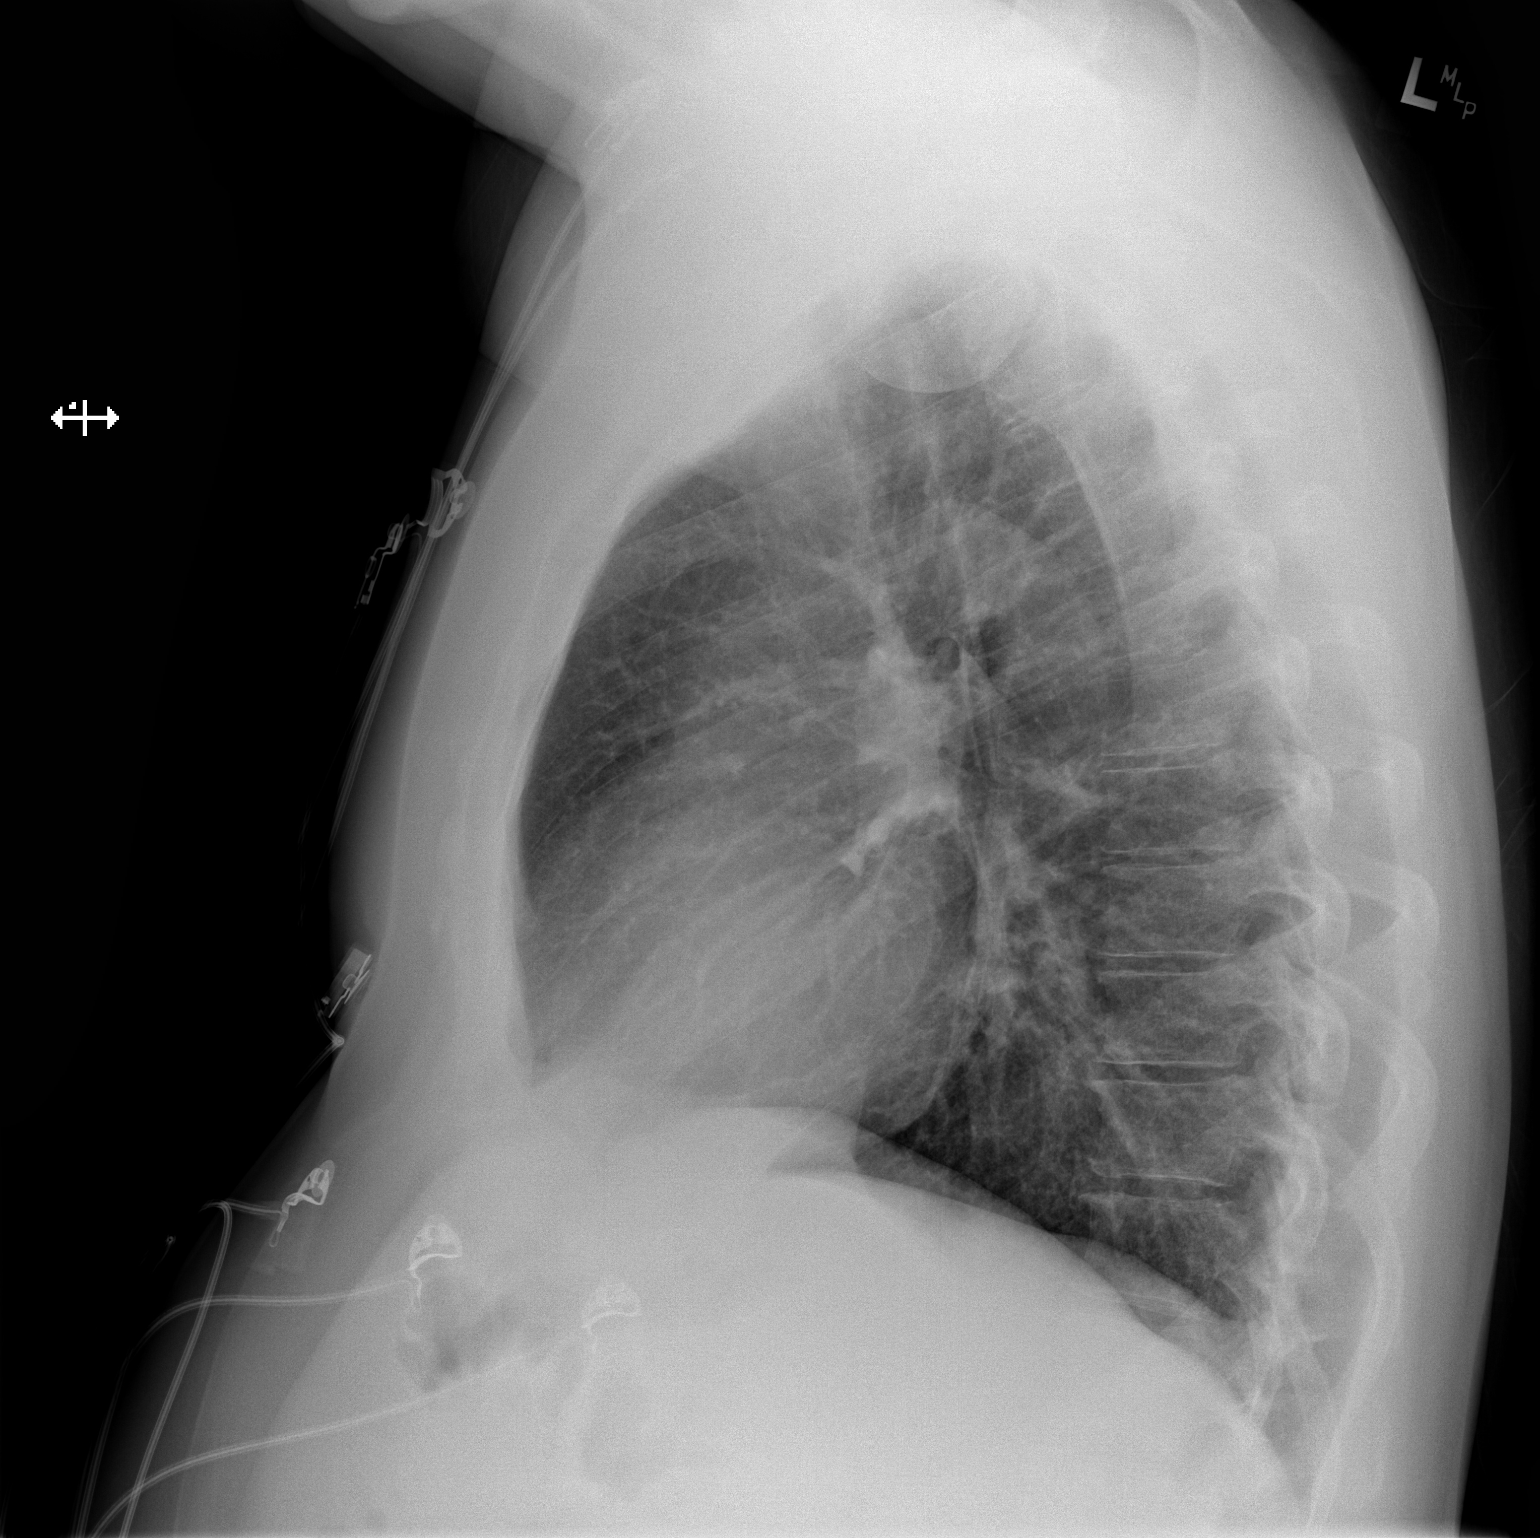

[2 of 2 positions shown; findings below may reference images not displayed]

FINDINGS: The heart size and mediastinal contours are within normal limits.
Both lungs are clear. The visualized skeletal structures are
unremarkable.
IMPRESSION: No active cardiopulmonary disease.

## 2022-04-30 IMAGING — CT CT HEART MORP W/ CTA COR W/ SCORE W/ CA W/CM &/OR W/O CM
4 of 7 series · 8 of 20 positions shown, 9 images · non-contrast
Comparison: 08/20/2019 chest radiograph.

Addendum:
HISTORY: Chest pain, cardiac cause suspected

EXAM:
Cardiac/Coronary  CT
TECHNIQUE: The patient was scanned on a Siemens Force scanner.
PROTOCOL: A 120 kV prospective scan was triggered in the descending thoracic
aorta at 111 HU's. Axial non-contrast 3 mm slices were carried out
through the heart. The data set was analyzed on a dedicated work
station and scored using the Agatson method. Gantry rotation speed
was 250 msecs and collimation was .6 mm. Beta blockade and 0.8 mg of
sl NTG was given. The 3D data set was reconstructed in 5% intervals
of the 67-82 % of the R-R cycle. Diastolic phases were analyzed on a
dedicated work station using MPR, MIP and VRT modes. The patient
received of contrast.

[Series 6: best diast 73 % · axial · 0.41mm/px · z∈[-223,-180]mm · 2 of 321 slices shown]
[im 107/321  vessel]
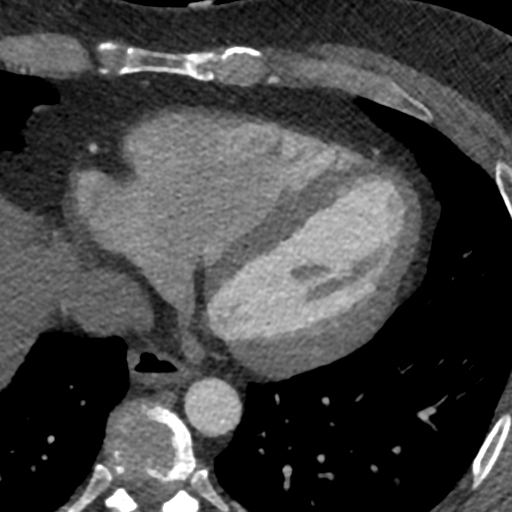
[im 214/321  vessel]
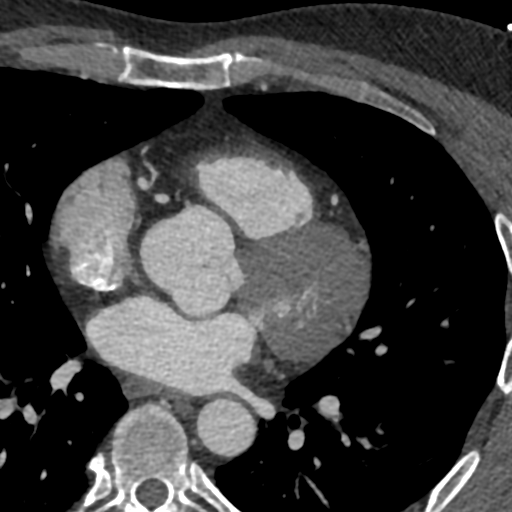

[Series 7: best syst · axial · 0.41mm/px · z∈[-223,-180]mm · 2 of 321 slices shown, 3 images]
[im 107/321  vessel]
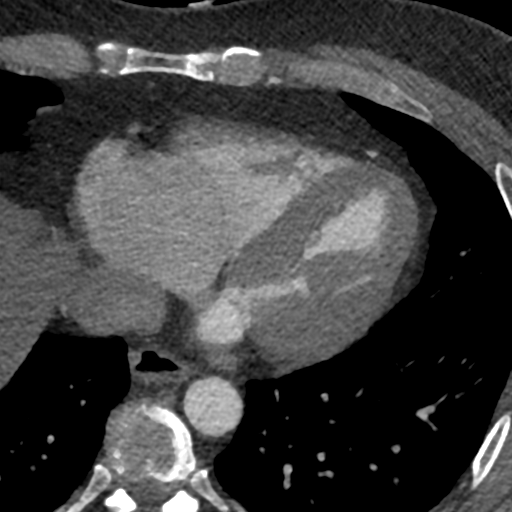
[im 107/321  lung]
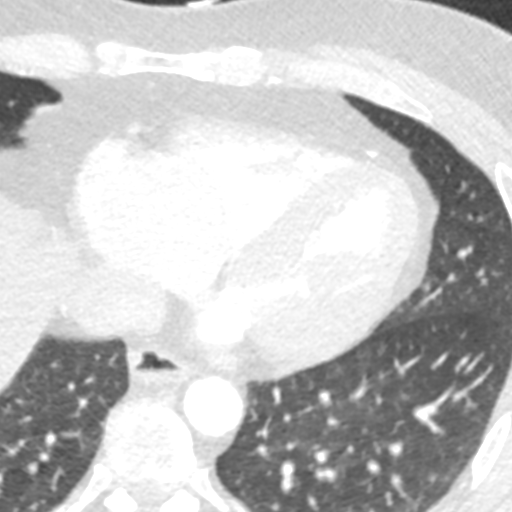
[im 214/321  vessel]
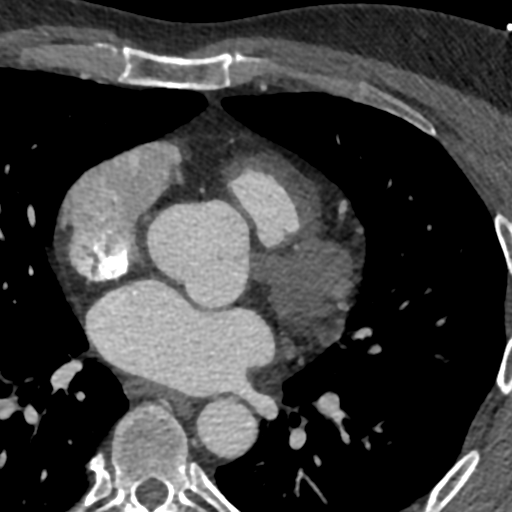

[Series 8: ts diast sharp 73 % · axial · 0.41mm/px · z∈[-223,-180]mm · 2 of 321 slices shown]
[im 107/321  lung]
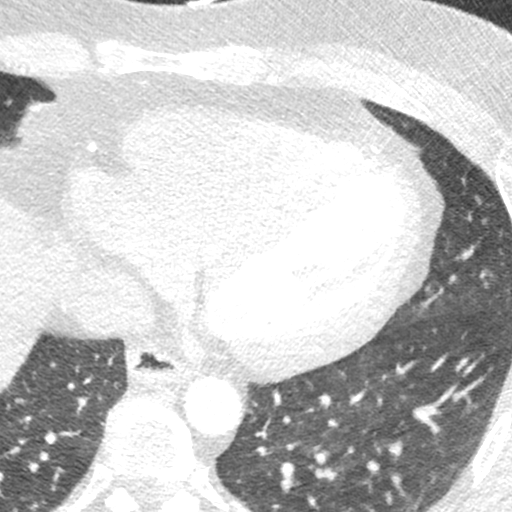
[im 214/321  lung]
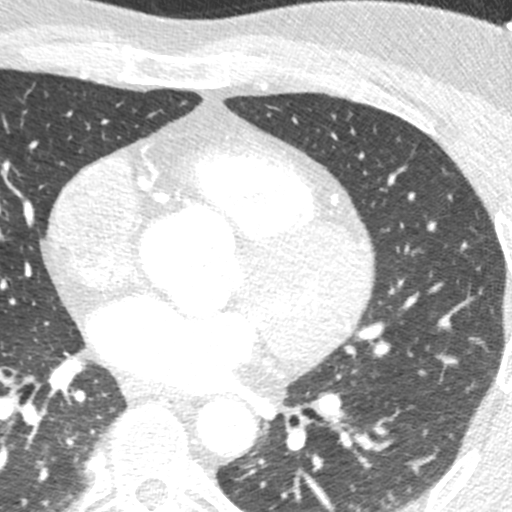

[Series 9: ts syst sharp · axial · 0.41mm/px · z∈[-223,-180]mm · 2 of 321 slices shown]
[im 107/321  lung]
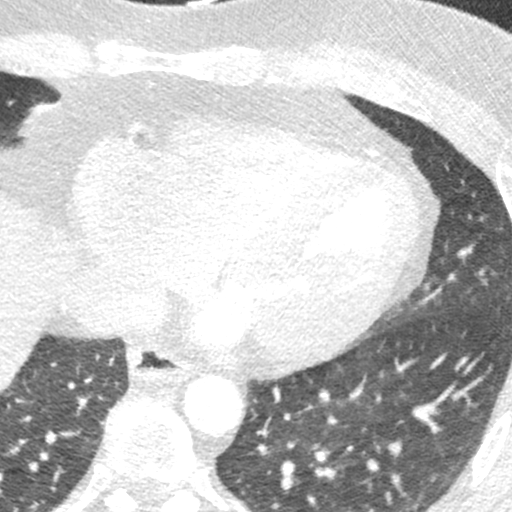
[im 214/321  lung]
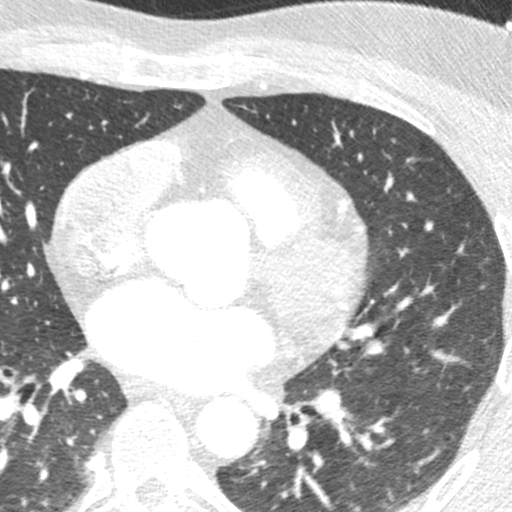

[8 of 20 positions shown; findings below may reference images not displayed]

FINDINGS: Coronary calcium score is 0.

Coronary arteries: Normal coronary origins.  Right dominance.

Right Coronary Artery: No detectable plaque or stenosis.

Left Main Coronary Artery: No detectable plaque or stenosis.

Left Anterior Descending Coronary Artery: No detectable plaque or
stenosis.

Left Circumflex Artery: No detectable plaque or stenosis.

Aorta: Dilated ascending aorta. No calcifications.  No dissection.

Measurements made in double oblique technique:

Sinus of Valsalva:

R-L: 44 mm

L-Non: 42 mm

R-Non: 39 mm

Mid ascending aorta (at PA bifurcation): 35 mm

Aortic Valve: No calcifications.

Other findings:

Normal pulmonary vein drainage into the left atrium.

Normal left atrial appendage without a thrombus.

Normal size of the pulmonary artery.

Mild right ventricular chamber enlargement.
IMPRESSION: 1. No evidence of CAD, CADRADS = 0.

2. Coronary calcium score of 0.

3. Normal coronary origin with right dominance.

4. Dilated ascending aorta, 44 mm at the sinus of Valsalva, right to
left coronary cusp.

ADDENDUM:
OVER-READ INTERPRETATION  CT CHEST

The following report is an over-read performed by radiologist Dr.
does not include interpretation of cardiac or coronary anatomy or
pathology. The coronary CTA interpretation by the cardiologist is
attached.
FINDINGS: No pleural fluid.  Clear imaged lungs.

Normal aortic caliber. No central pulmonary embolism, on this
non-dedicated study.

No imaged thoracic adenopathy.

Fluid in the esophagus, including on 37/11.

Subcentimeter hepatic dome low-density lesion is likely a cyst.
Normal imaged portions of the spleen, stomach.

No acute osseous abnormality.
IMPRESSION: 1.  No acute findings in the imaged extracardiac chest.
2. Esophageal air fluid level suggests dysmotility or
gastroesophageal reflux.

*** End of Addendum ***
FINDINGS: Coronary calcium score is 0.

Coronary arteries: Normal coronary origins.  Right dominance.

Right Coronary Artery: No detectable plaque or stenosis.

Left Main Coronary Artery: No detectable plaque or stenosis.

Left Anterior Descending Coronary Artery: No detectable plaque or
stenosis.

Left Circumflex Artery: No detectable plaque or stenosis.

Aorta: Dilated ascending aorta. No calcifications.  No dissection.

Measurements made in double oblique technique:

Sinus of Valsalva:

R-L: 44 mm

L-Non: 42 mm

R-Non: 39 mm

Mid ascending aorta (at PA bifurcation): 35 mm

Aortic Valve: No calcifications.

Other findings:

Normal pulmonary vein drainage into the left atrium.

Normal left atrial appendage without a thrombus.

Normal size of the pulmonary artery.

Mild right ventricular chamber enlargement.
IMPRESSION: 1. No evidence of CAD, CADRADS = 0.

2. Coronary calcium score of 0.

3. Normal coronary origin with right dominance.

4. Dilated ascending aorta, 44 mm at the sinus of Valsalva, right to
left coronary cusp.

## 2022-05-04 ENCOUNTER — Ambulatory Visit: Payer: Managed Care, Other (non HMO) | Admitting: Nurse Practitioner

## 2022-05-06 MED ORDER — AMLODIPINE BESYLATE 5 MG PO TABS: 5.0000 mg | ORAL_TABLET | Freq: Every day | ORAL | 1 refills | Status: DC

## 2022-05-06 NOTE — Telephone Encounter (Signed)
Please see the MyChart message reply(ies) for my assessment and plan.  The patient gave consent for this Medical Advice Message and is aware that it may result in a bill to their insurance company as well as the possibility that this may result in a co-payment or deductible. They are an established patient, but are not seeking medical advice exclusively about a problem treated during an in person or video visit in the last 7 days. I did not recommend an in person or video visit within 7 days of my reply.  I spent a total of 93mnutes cumulative time within 7 days through [facility] NP

## 2022-06-15 ENCOUNTER — Ambulatory Visit: Payer: Managed Care, Other (non HMO) | Admitting: Physician Assistant

## 2022-07-22 ENCOUNTER — Encounter: Payer: Self-pay | Admitting: Nurse Practitioner

## 2022-07-22 ENCOUNTER — Ambulatory Visit: Payer: Managed Care, Other (non HMO) | Admitting: Nurse Practitioner

## 2022-07-22 VITALS — BP 120/84 | HR 58 | Temp 98.1°F | Resp 16 | Ht 70.0 in | Wt 251.6 lb

## 2022-07-22 DIAGNOSIS — R739 Hyperglycemia, unspecified: Secondary | ICD-10-CM

## 2022-07-22 DIAGNOSIS — E291 Testicular hypofunction: Secondary | ICD-10-CM

## 2022-07-22 DIAGNOSIS — J310 Chronic rhinitis: Secondary | ICD-10-CM

## 2022-07-22 DIAGNOSIS — I10 Essential (primary) hypertension: Secondary | ICD-10-CM

## 2022-07-22 DIAGNOSIS — E559 Vitamin D deficiency, unspecified: Secondary | ICD-10-CM

## 2022-07-22 DIAGNOSIS — E785 Hyperlipidemia, unspecified: Secondary | ICD-10-CM

## 2022-07-22 DIAGNOSIS — F411 Generalized anxiety disorder: Secondary | ICD-10-CM

## 2022-07-22 MED ORDER — AMLODIPINE BESYLATE 5 MG PO TABS
5.0000 mg | ORAL_TABLET | Freq: Every day | ORAL | 1 refills | Status: DC
Start: 2022-07-22 — End: 2023-03-01

## 2022-07-22 NOTE — Assessment & Plan Note (Signed)
Improved BP control with amlodipine BP Readings from Last 3 Encounters:  07/22/22 120/84  04/23/22 (!) 128/90  03/18/22 130/80    Maintain med dose

## 2022-07-22 NOTE — Assessment & Plan Note (Signed)
Repeat lipid panel Maintain fenofibrate dose

## 2022-07-22 NOTE — Assessment & Plan Note (Signed)
Repeat hgbA1c Advised to maintain heart healthy diet and daily exercise

## 2022-07-22 NOTE — Progress Notes (Signed)
Established Patient Visit  Patient: Shane Munoz   DOB: 1980/05/11   42 y.o. Adult  MRN: [id number] Visit Date: 07/22/2022  Subjective:    Chief Complaint  Patient presents with   Medical Management of Chronic Issues    Fasting and refill for Albuterol inhaler    HPI HTN (hypertension), benign Improved BP control with amlodipine BP Readings from Last 3 Encounters:  07/22/22 120/84  04/23/22 (!) 128/90  03/18/22 130/80    Maintain med dose  Hypogonadism in male D/c intranasal testosterone 2weeks ago due to possible effect on lipid panel and BP. He has informed urologist about this decision. Repeat lipid panel and PSA in 3months  Hyperglycemia Repeat hgbA1c Advised to maintain heart healthy diet and daily exercise  Dyslipidemia Repeat lipid panel Maintain fenofibrate dose  Vitamin D deficiency Repeat vit D  Chronic rhinitis He used albuterol at hs prn due to nasal congestion and sensation of SOB. Current use of xyzal, montelukast, and allergy injections every 2weeks. Unable to tolerate flonase (nosebleed) Under the care of [facility] allergist.  Advised about to use saline sinus rinse daily in PM. No need for albuterol refill, since symptoms are not lung related   GAD (generalized anxiety disorder) He opted to discontinue effexor in February over 4weeks. Stable mood  Wt Readings from Last 3 Encounters:  07/22/22 251 lb 9.6 oz (114.1 kg)  04/23/22 250 lb (113.4 kg)  03/18/22 245 lb 12.8 oz (111.5 kg)      Reviewed medical, surgical, and social history today  Medications: Outpatient Medications Prior to Visit  Medication Sig   EPINEPHrine 0.3 mg/0.3 mL IJ SOAJ injection See admin instructions.   fenofibrate (TRICOR) 145 MG tablet Take 1 tablet (145 mg total) by mouth daily.   levocetirizine (XYZAL) 5 MG tablet Take 5 mg by mouth every evening.   montelukast (SINGULAIR) 10 MG tablet Take 10 mg by mouth at bedtime.   Testosterone (NATESTO)  5.5 MG/ACT GEL Apply 1 pump (5.5mg ) in each nostril 3 times daily (33 mg total).  3 dispensers = 30-day supply.   valACYclovir (VALTREX) 1000 MG tablet TAKE 2 TABLETS(2000 MG) BY MOUTH TWICE DAILY FOR 1 DAY   [DISCONTINUED] albuterol (VENTOLIN HFA) 108 (90 Base) MCG/ACT inhaler INHALE ONE TO TWO PUFFS BY MOUTH EVERY 4 TO 6 HOURS AS NEEDED FOR COUGH AND FOR WHEEZING   [DISCONTINUED] amLODipine (NORVASC) 5 MG tablet Take 1 tablet (5 mg total) by mouth at bedtime.   [DISCONTINUED] azelastine (ASTELIN) 0.1 % nasal spray SPRAY ONE TO TWO SPRAYS IN EACH NOSTRIL TWICE DAILY   [DISCONTINUED] venlafaxine XR (EFFEXOR XR) 75 MG 24 hr capsule Take 1 capsule (75 mg total) by mouth daily with breakfast.   No facility-administered medications prior to visit.   Reviewed past medical and social history.   ROS per HPI above      Objective:  BP 120/84 (BP Location: Right Arm, Patient Position: Sitting, Cuff Size: Large)   Pulse (!) 58   Temp 98.1 F (36.7 C) (Temporal)   Resp 16   Ht 5\' 10"  (1.778 m)   Wt 251 lb 9.6 oz (114.1 kg)   SpO2 97%   BMI 36.10 kg/m      Physical Exam Cardiovascular:     Rate and Rhythm: Normal rate and regular rhythm.     Pulses: Normal pulses.     Heart sounds: Normal heart sounds.  Pulmonary:  Effort: Pulmonary effort is normal.     Breath sounds: Normal breath sounds.  Neurological:     Mental Status: Efford Denio is alert and oriented to person, place, and time.     No results found for any visits on 07/22/22.    Assessment & Plan:    Problem List Items Addressed This Visit       Cardiovascular and Mediastinum   HTN (hypertension), benign    Improved BP control with amlodipine BP Readings from Last 3 Encounters:  07/22/22 120/84  04/23/22 (!) 128/90  03/18/22 130/80    Maintain med dose      Relevant Medications   amLODipine (NORVASC) 5 MG tablet   Other Relevant Orders   Basic metabolic panel     Respiratory   Chronic rhinitis    He  used albuterol at hs prn due to nasal congestion and sensation of SOB. Current use of xyzal, montelukast, and allergy injections every 2weeks. Unable to tolerate flonase (nosebleed) Under the care of [facility] allergist.  Advised about to use saline sinus rinse daily in PM. No need for albuterol refill, since symptoms are not lung related         Endocrine   Hypogonadism in male    D/c intranasal testosterone 2weeks ago due to possible effect on lipid panel and BP. He has informed urologist about this decision. Repeat lipid panel and PSA in 3months      Relevant Orders   PSA     Other   Dyslipidemia - Primary    Repeat lipid panel Maintain fenofibrate dose      Relevant Orders   Lipid panel   Hepatic function panel   GAD (generalized anxiety disorder)    He opted to discontinue effexor in February over 4weeks. Stable mood      Hyperglycemia    Repeat hgbA1c Advised to maintain heart healthy diet and daily exercise      Relevant Orders   Hemoglobin A1c   Vitamin D deficiency    Repeat vit D      Relevant Orders   VITAMIN D 25 Hydroxy (Vit-D Deficiency, Fractures)   Return in about 6 months (around 01/22/2023) for CPE (fasting).     Alysia Penna, NP

## 2022-07-22 NOTE — Assessment & Plan Note (Signed)
D/c intranasal testosterone 2weeks ago due to possible effect on lipid panel and BP. He has informed urologist about this decision. Repeat lipid panel and PSA in 3months

## 2022-07-22 NOTE — Assessment & Plan Note (Signed)
Repeat vit. D °

## 2022-07-22 NOTE — Assessment & Plan Note (Signed)
He used albuterol at hs prn due to nasal congestion and sensation of SOB. Current use of xyzal, montelukast, and allergy injections every 2weeks. Unable to tolerate flonase (nosebleed) Under the care of [facility] allergist.  Advised about to use saline sinus rinse daily in PM. No need for albuterol refill, since symptoms are not lung related

## 2022-07-22 NOTE — Patient Instructions (Addendum)
Use saline sinus rinse daily to help with nasal and sinus congestion. Schedule fasting lab appt. Need to be fasting 8hrs prior to blood draw. Ok to drink water and take BP meds. Continue Heart healthy diet and daily exercise. Maintain current medications.

## 2022-07-22 NOTE — Assessment & Plan Note (Signed)
He opted to discontinue effexor in February over 4weeks. Stable mood

## 2022-08-10 ENCOUNTER — Other Ambulatory Visit: Payer: Self-pay | Admitting: Nurse Practitioner

## 2022-08-10 DIAGNOSIS — E781 Pure hyperglyceridemia: Secondary | ICD-10-CM

## 2022-09-10 ENCOUNTER — Telehealth: Payer: Managed Care, Other (non HMO) | Admitting: Family Medicine

## 2022-09-10 DIAGNOSIS — Z91199 Patient's noncompliance with other medical treatment and regimen due to unspecified reason: Secondary | ICD-10-CM

## 2022-09-10 NOTE — Patient Instructions (Signed)
  Tinnie Gens, no showed to appointment.

## 2022-09-10 NOTE — Progress Notes (Signed)
  Virtual Visit Consent   Shane Munoz, no showed to appointment.  Reed Pandy, PA-C

## 2022-09-11 ENCOUNTER — Telehealth: Payer: Managed Care, Other (non HMO) | Admitting: Nurse Practitioner

## 2022-09-11 DIAGNOSIS — K219 Gastro-esophageal reflux disease without esophagitis: Secondary | ICD-10-CM

## 2022-09-11 MED ORDER — FAMOTIDINE 20 MG PO TABS
20.0000 mg | ORAL_TABLET | Freq: Two times a day (BID) | ORAL | 0 refills | Status: DC | PRN
Start: 2022-09-11 — End: 2022-09-28

## 2022-09-11 MED ORDER — PANTOPRAZOLE SODIUM 20 MG PO TBEC
20.0000 mg | DELAYED_RELEASE_TABLET | Freq: Every day | ORAL | 0 refills | Status: DC
Start: 2022-09-11 — End: 2022-10-09
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
## 2022-09-28 ENCOUNTER — Encounter: Payer: Self-pay | Admitting: Nurse Practitioner

## 2022-09-28 ENCOUNTER — Ambulatory Visit: Payer: Managed Care, Other (non HMO) | Admitting: Nurse Practitioner

## 2022-09-28 VITALS — BP 126/84 | HR 57 | Temp 98.0°F | Wt 238.0 lb

## 2022-09-28 DIAGNOSIS — E291 Testicular hypofunction: Secondary | ICD-10-CM | POA: Diagnosis not present

## 2022-09-28 DIAGNOSIS — K219 Gastro-esophageal reflux disease without esophagitis: Secondary | ICD-10-CM

## 2022-09-28 DIAGNOSIS — E785 Hyperlipidemia, unspecified: Secondary | ICD-10-CM | POA: Diagnosis not present

## 2022-09-28 DIAGNOSIS — R001 Bradycardia, unspecified: Secondary | ICD-10-CM | POA: Insufficient documentation

## 2022-09-28 DIAGNOSIS — I10 Essential (primary) hypertension: Secondary | ICD-10-CM

## 2022-09-28 DIAGNOSIS — R739 Hyperglycemia, unspecified: Secondary | ICD-10-CM

## 2022-09-28 DIAGNOSIS — E559 Vitamin D deficiency, unspecified: Secondary | ICD-10-CM

## 2022-09-28 DIAGNOSIS — R1032 Left lower quadrant pain: Secondary | ICD-10-CM

## 2022-09-28 LAB — BASIC METABOLIC PANEL
BUN: 13 mg/dL (ref 6–23)
CO2: 26 mEq/L (ref 19–32)
Calcium: 9.6 mg/dL (ref 8.4–10.5)
Chloride: 103 mEq/L (ref 96–112)
Creatinine, Ser: 0.93 mg/dL (ref 0.40–1.50)
GFR: 100.23 mL/min (ref >60.00)
Glucose, Bld: 96 mg/dL (ref 70–99)
Potassium: 4.1 mEq/L (ref 3.5–5.1)
Sodium: 141 mEq/L (ref 135–145)

## 2022-09-28 LAB — LIPID PANEL
Cholesterol: 182 mg/dL (ref 0–200)
HDL: 39.1 mg/dL (ref >39.00)
LDL Cholesterol: 106 mg/dL — ABNORMAL HIGH (ref 0–99)
NonHDL: 143.19
Total CHOL/HDL Ratio: 5
Triglycerides: 188 mg/dL — ABNORMAL HIGH (ref 0.0–149.0)
VLDL: 37.6 mg/dL (ref 0.0–40.0)

## 2022-09-28 LAB — CBC WITH DIFFERENTIAL/PLATELET
Basophils Absolute: 0 10*3/uL (ref 0.0–0.1)
Basophils Relative: 0.8 % (ref 0.0–3.0)
Eosinophils Absolute: 0.1 10*3/uL (ref 0.0–0.7)
Eosinophils Relative: 1 % (ref 0.0–5.0)
HCT: 44.8 % (ref 39.0–52.0)
Hemoglobin: 14.9 g/dL (ref 13.0–17.0)
Lymphocytes Relative: 30.9 % (ref 12.0–46.0)
Lymphs Abs: 1.7 10*3/uL (ref 0.7–4.0)
MCHC: 33.2 g/dL (ref 30.0–36.0)
MCV: 85.2 fl (ref 78.0–100.0)
Monocytes Absolute: 0.4 10*3/uL (ref 0.1–1.0)
Monocytes Relative: 7.7 % (ref 3.0–12.0)
Neutro Abs: 3.3 10*3/uL (ref 1.4–7.7)
Neutrophils Relative %: 59.6 % (ref 43.0–77.0)
Platelets: 280 10*3/uL (ref 150.0–400.0)
RBC: 5.26 Mil/uL (ref 4.22–5.81)
RDW: 12.7 % (ref 11.5–15.5)
WBC: 5.5 10*3/uL (ref 4.0–10.5)

## 2022-09-28 LAB — HEPATIC FUNCTION PANEL
ALT: 22 U/L (ref 0–53)
AST: 14 U/L (ref 0–37)
Albumin: 4.5 g/dL (ref 3.5–5.2)
Alkaline Phosphatase: 73 U/L (ref 39–117)
Bilirubin, Direct: 0.1 mg/dL (ref 0.0–0.3)
Total Bilirubin: 0.5 mg/dL (ref 0.2–1.2)
Total Protein: 6.5 g/dL (ref 6.0–8.3)

## 2022-09-28 LAB — HEMOGLOBIN A1C: Hgb A1c MFr Bld: 5.7 % (ref 4.6–6.5)

## 2022-09-28 LAB — VITAMIN D 25 HYDROXY (VIT D DEFICIENCY, FRACTURES): VITD: 16.74 ng/mL — ABNORMAL LOW (ref 30.00–100.00)

## 2022-09-28 LAB — PSA: PSA: 0.22 ng/mL (ref 0.10–4.00)

## 2022-09-28 NOTE — Assessment & Plan Note (Signed)
Resolved GERD symptoms with pantoprazole 20mg  QD and famotidine 20mg  BID. Current use of only pantoprazole 20mg  every day at this time

## 2022-09-28 NOTE — Addendum Note (Signed)
Addended by: Alysia Penna L on: 09/28/2022 03:30 PM   Modules accepted: Orders

## 2022-09-28 NOTE — Patient Instructions (Addendum)
Go to lab You will be contacted to schedule appointment for CT and appointment with cardiology. Maintain clear liquid diet x 24hrs, then bland diet x 24hrs, then slowly return to normal diet as tolerated Start probiotic (align or curturelle or align) 1ca daily. Go to ED if Gi symptoms worsen

## 2022-09-28 NOTE — Progress Notes (Signed)
Established Patient Visit  Patient: Shane Munoz   DOB: 27-Jul-1980   42 y.o. Adult  MRN: [id number] Visit Date: 09/28/2022  Subjective:    Chief Complaint  Patient presents with   Medical Management of Chronic Issues    Lower left abdominal pain x Wednesday. No urinary issues.    Abdominal Pain This is a new problem. The current episode started in the past 7 days. The onset quality is gradual. The problem occurs constantly. The problem has been unchanged. The pain is located in the LLQ. The pain is moderate. The quality of the pain is colicky and a sensation of fullness. The abdominal pain does not radiate. Pertinent negatives include no anorexia, arthralgias, belching, constipation, diarrhea, dysuria, fever, flatus, frequency, headaches, hematochezia, hematuria, melena, myalgias, nausea, vomiting or weight loss. Associated symptoms comments: Increased stool frequency. Nothing aggravates the pain. The pain is relieved by Nothing. Shivaan has tried H2 blockers and proton pump inhibitors for the symptoms. The treatment provided no relief. Prior diagnostic workup includes upper endoscopy. Valmore's past medical history is significant for GERD. There is no history of gallstones, pancreatitis, PUD or ulcerative colitis.   Sinus bradycardia Baseline HR: 60s HR in 50s first noted May 2024 For last 2months, With use of smart watch he noticed Average resting HR 49-53, Sleeping HR 39-61, Walking HR 71-87, workout  HR 59-127. His norma exercise regimen: walking daily He denies any dizziness or syncope or CP or palpitations. He had CT cardiac coronary score 2021: normal, echocardiogram 2021: normal Current use of amlodipine for HYPERTENSION management (started 04/2022)  ECG today: S. Bradycardia, HR 53, normal intervals Ref to cardiology  Gastro-esophageal reflux disease without esophagitis Resolved GERD symptoms with pantoprazole 20mg  QD and famotidine 20mg  BID. Current  use of only pantoprazole 20mg  every day at this time  He also needs to complete labs ordered from previous office visit.  Reviewed medical, surgical, and social history today  Medications: Outpatient Medications Prior to Visit  Medication Sig   amLODipine (NORVASC) 5 MG tablet Take 1 tablet (5 mg total) by mouth at bedtime.   EPINEPHrine 0.3 mg/0.3 mL IJ SOAJ injection See admin instructions.   fenofibrate (TRICOR) 145 MG tablet TAKE ONE TABLET BY MOUTH ONE TIME DAILY   levocetirizine (XYZAL) 5 MG tablet Take 5 mg by mouth every evening.   montelukast (SINGULAIR) 10 MG tablet Take 10 mg by mouth at bedtime.   pantoprazole (PROTONIX) 20 MG tablet Take 1 tablet (20 mg total) by mouth daily.   valACYclovir (VALTREX) 1000 MG tablet TAKE 2 TABLETS(2000 MG) BY MOUTH TWICE DAILY FOR 1 DAY   [DISCONTINUED] famotidine (PEPCID) 20 MG tablet Take 1 tablet (20 mg total) by mouth 2 (two) times daily as needed for heartburn or indigestion. (Patient not taking: Reported on 09/28/2022)   [DISCONTINUED] Testosterone (NATESTO) 5.5 MG/ACT GEL Apply 1 pump (5.5mg ) in each nostril 3 times daily (33 mg total).  3 dispensers = 30-day supply. (Patient not taking: Reported on 09/28/2022)   No facility-administered medications prior to visit.   Reviewed past medical and social history.   ROS per HPI above  Last CBC Lab Results  Component Value Date   WBC 8.4 02/02/2022   HGB 16.0 02/02/2022   HCT 46.3 02/02/2022   MCV 83.5 02/02/2022   MCH 29.0 08/21/2019   RDW 13.0 02/02/2022   PLT 302.0 02/02/2022   Last metabolic panel Lab Results  Component Value Date   GLUCOSE 101 (H) 07/21/2021   NA 140 07/21/2021   K 3.9 07/21/2021   CL 103 07/21/2021   CO2 26 07/21/2021   BUN 12 07/21/2021   CREATININE 0.96 07/21/2021   GFR 97.30 07/21/2021   CALCIUM 9.3 07/21/2021   PROT 7.5 02/02/2022   ALBUMIN 4.6 02/02/2022   BILITOT 0.5 02/02/2022   ALKPHOS 102 02/02/2022   AST 15 02/02/2022   ALT 27 02/02/2022    ANIONGAP 14 08/21/2019   Last thyroid functions Lab Results  Component Value Date   TSH 2.19 02/02/2022      Objective:  BP 126/84 (BP Location: Right Arm, Patient Position: Sitting, Cuff Size: Large)   Pulse (!) 57   Temp 98 F (36.7 C) (Temporal)   Wt 238 lb (108 kg)   SpO2 98%   BMI 34.15 kg/m      Physical Exam Vitals and nursing note reviewed.  Constitutional:      General: Saban is not in acute distress. Cardiovascular:     Rate and Rhythm: Normal rate and regular rhythm.     Pulses: Normal pulses.     Heart sounds: Normal heart sounds.  Pulmonary:     Effort: Pulmonary effort is normal.     Breath sounds: Normal breath sounds.  Abdominal:     General: There is no distension.     Palpations: Abdomen is soft. There is no mass.     Tenderness: There is abdominal tenderness in the left lower quadrant. There is guarding. There is no right CVA tenderness, left CVA tenderness or rebound.     Hernia: No hernia is present.  Neurological:     Mental Status: Dequavious is alert and oriented to person, place, and time.     No results found for any visits on 09/28/22.    Assessment & Plan:    Problem List Items Addressed This Visit       Cardiovascular and Mediastinum   HTN (hypertension), benign   Sinus bradycardia - Primary    Baseline HR: 60s HR in 50s first noted May 2024 For last 2months, With use of smart watch he noticed Average resting HR 49-53, Sleeping HR 39-61, Walking HR 71-87, workout  HR 59-127. His norma exercise regimen: walking daily He denies any dizziness or syncope or CP or palpitations. He had CT cardiac coronary score 2021: normal, echocardiogram 2021: normal Current use of amlodipine for HYPERTENSION management (started 04/2022)  ECG today: S. Bradycardia, HR 53, normal intervals Ref to cardiology      Relevant Orders   EKG 12-Lead (Completed)   Ambulatory referral to Cardiology     Digestive   Gastro-esophageal reflux  disease without esophagitis    Resolved GERD symptoms with pantoprazole 20mg  QD and famotidine 20mg  BID. Current use of only pantoprazole 20mg  every day at this time        Endocrine   Hypogonadism in male     Other   Dyslipidemia   Hyperglycemia   Vitamin D deficiency   Other Visit Diagnoses     Continuous LLQ abdominal pain       Relevant Orders   CT ABDOMEN PELVIS W CONTRAST   CBC with Differential/Platelet     CT ABDOMEN/Pelvis ordered to r/o acute diverticulitis. Advised to maintain clear liquid diet x 24hrs, then bland diet x 24hrs, then slowly return to normal diet as tolerated. Start probiotic (align or curturelle or align) 1ca daily.  With absence of fever and  acute abdomen, no need for oral abx at this time. Advised to Go to ED if GI symptoms worsen.  Return if symptoms worsen or fail to improve.     Alysia Penna, NP

## 2022-09-28 NOTE — Assessment & Plan Note (Addendum)
Baseline HR: 60s HR in 50s first noted May 2024 For last 2months, With use of smart watch he noticed Average resting HR 49-53, Sleeping HR 39-61, Walking HR 71-87, workout  HR 59-127. His norma exercise regimen: walking daily He denies any dizziness or syncope or CP or palpitations. He had CT cardiac coronary score 2021: normal, echocardiogram 2021: normal Current use of amlodipine for HYPERTENSION management (started 04/2022)  ECG today: S. Bradycardia, HR 53, normal intervals Ref to cardiology

## 2022-09-29 ENCOUNTER — Inpatient Hospital Stay: Admission: RE | Admit: 2022-09-29 | Payer: Managed Care, Other (non HMO) | Source: Ambulatory Visit

## 2022-09-29 MED ORDER — VITAMIN D (ERGOCALCIFEROL) 1.25 MG (50000 UNIT) PO CAPS
50000.0000 [IU] | ORAL_CAPSULE | ORAL | 1 refills | Status: DC
Start: 2022-09-29 — End: 2023-06-01

## 2022-09-29 NOTE — Addendum Note (Signed)
Addended by: Michaela Corner on: 09/29/2022 03:44 PM   Modules accepted: Orders

## 2022-10-01 ENCOUNTER — Ambulatory Visit
Admission: RE | Admit: 2022-10-01 | Discharge: 2022-10-01 | Disposition: A | Discharge status: Home / Self Care | Payer: Managed Care, Other (non HMO) | Source: Ambulatory Visit | Attending: Nurse Practitioner | Admitting: Nurse Practitioner

## 2022-10-01 DIAGNOSIS — R1032 Left lower quadrant pain: Secondary | ICD-10-CM

## 2022-10-01 MED ORDER — IOPAMIDOL (ISOVUE-300) INJECTION 61%: 100.0000 mL | Freq: Once | INTRAVENOUS | Status: DC | PRN

## 2022-10-02 ENCOUNTER — Ambulatory Visit
Admission: RE | Admit: 2022-10-02 | Discharge: 2022-10-02 | Disposition: A | Discharge status: Home / Self Care | Payer: Managed Care, Other (non HMO) | Source: Ambulatory Visit | Attending: Nurse Practitioner | Admitting: Nurse Practitioner

## 2022-10-02 MED ORDER — IOPAMIDOL (ISOVUE-300) INJECTION 61%
100.0000 mL | Freq: Once | INTRAVENOUS | Status: AC | PRN
  Administered 2022-10-02: 100 mL via INTRAVENOUS

## 2022-10-05 ENCOUNTER — Other Ambulatory Visit: Payer: Self-pay | Admitting: Nurse Practitioner

## 2022-10-05 DIAGNOSIS — R198 Other specified symptoms and signs involving the digestive system and abdomen: Secondary | ICD-10-CM

## 2022-10-05 DIAGNOSIS — R1032 Left lower quadrant pain: Secondary | ICD-10-CM

## 2022-10-09 ENCOUNTER — Other Ambulatory Visit: Payer: Self-pay | Admitting: Nurse Practitioner

## 2022-10-09 DIAGNOSIS — K219 Gastro-esophageal reflux disease without esophagitis: Secondary | ICD-10-CM

## 2022-10-11 ENCOUNTER — Encounter: Payer: Self-pay | Admitting: Nurse Practitioner

## 2022-10-23 ENCOUNTER — Encounter: Payer: Self-pay | Admitting: Nurse Practitioner

## 2022-10-27 ENCOUNTER — Ambulatory Visit (INDEPENDENT_AMBULATORY_CARE_PROVIDER_SITE_OTHER)
Admission: RE | Admit: 2022-10-27 | Discharge: 2022-10-27 | Disposition: A | Discharge status: Home / Self Care | Payer: Managed Care, Other (non HMO) | Source: Ambulatory Visit | Attending: Nurse Practitioner | Admitting: Nurse Practitioner

## 2022-10-27 ENCOUNTER — Ambulatory Visit: Payer: Managed Care, Other (non HMO) | Admitting: Nurse Practitioner

## 2022-10-27 ENCOUNTER — Encounter: Payer: Self-pay | Admitting: Nurse Practitioner

## 2022-10-27 VITALS — BP 110/80 | HR 86 | Temp 98.0°F | Resp 16 | Ht 70.0 in | Wt 227.0 lb

## 2022-10-27 DIAGNOSIS — M546 Pain in thoracic spine: Secondary | ICD-10-CM | POA: Insufficient documentation

## 2022-10-27 DIAGNOSIS — K219 Gastro-esophageal reflux disease without esophagitis: Secondary | ICD-10-CM | POA: Diagnosis not present

## 2022-10-27 DIAGNOSIS — M5134 Other intervertebral disc degeneration, thoracic region: Secondary | ICD-10-CM | POA: Diagnosis not present

## 2022-10-27 MED ORDER — PANTOPRAZOLE SODIUM 20 MG PO TBEC
20.0000 mg | DELAYED_RELEASE_TABLET | Freq: Two times a day (BID) | ORAL | 5 refills | Status: DC
Start: 2022-10-27 — End: 2023-05-20

## 2022-10-27 NOTE — Patient Instructions (Signed)
Go to [address] for x-ray Increase pantoprazole to 20mg  BID

## 2022-10-27 NOTE — Progress Notes (Signed)
Established Patient Visit  Patient: Tristain Gottshall   DOB: [birth year]   42 y.o. Adult  MRN: [id number] Visit Date: 10/27/2022  Subjective:    Chief Complaint  Patient presents with   Back Pain    Pain is under the shoulder blades. Going on for a month   Back Pain   Gastro-esophageal reflux disease without esophagitis Persistent throat clearing, lump in chest, chest tight with inspiration. No SOB, no wheezing, no exertional symptoms. Peak flow measurement: 500 x2 Current use of pantoprazole 20mg  every day  Increase PPI to 20mg  BID Consider resuming anxiety med if no improvement and F/up with GI  Thoracic spine pain Onset 44month ago, located-mid upper back, dull, constant, no specific trigger or associated symptoms, activity or ROM is not restricted, does not interfere with sleep. Denies any injury, no rash, no SOB, no nausea, no ABDOMEN pain, no change in appetite Weight loss noted-intentional Exercise regimen: walking daily, denies ay weight training exercise.  Compression fracture vs herniated disc vs AS vs muscle strain? Get x-ray thoracic spine Consider outpatient PT if normal x-ray  Wt Readings from Last 3 Encounters:  10/27/22 227 lb (103 kg)  09/28/22 238 lb (108 kg)  07/22/22 251 lb 9.6 oz (114.1 kg)    Reviewed medical, surgical, and social history today  Medications: Outpatient Medications Prior to Visit  Medication Sig   amLODipine (NORVASC) 5 MG tablet Take 1 tablet (5 mg total) by mouth at bedtime.   EPINEPHrine 0.3 mg/0.3 mL IJ SOAJ injection See admin instructions.   fenofibrate (TRICOR) 145 MG tablet TAKE ONE TABLET BY MOUTH ONE TIME DAILY   levocetirizine (XYZAL) 5 MG tablet Take 5 mg by mouth every evening.   valACYclovir (VALTREX) 1000 MG tablet TAKE 2 TABLETS(2000 MG) BY MOUTH TWICE DAILY FOR 1 DAY   Vitamin D, Ergocalciferol, (DRISDOL) 1.25 MG (50000 UNIT) CAPS capsule Take 1 capsule (50,000 Units total) by mouth every 7 (seven) days.    [DISCONTINUED] montelukast (SINGULAIR) 10 MG tablet Take 10 mg by mouth at bedtime.   [DISCONTINUED] pantoprazole (PROTONIX) 20 MG tablet TAKE ONE TABLET BY MOUTH ONE TIME DAILY   No facility-administered medications prior to visit.   Reviewed past medical and social history.   ROS per HPI above      Objective:  BP 110/80 (BP Location: Left Arm, Patient Position: Sitting, Cuff Size: Normal)   Pulse 86   Temp 98 F (36.7 C)   Resp 16   Ht 5\' 10"  (1.778 m)   Wt 227 lb (103 kg)   SpO2 98%   BMI 32.57 kg/m      Physical Exam Vitals and nursing note reviewed.  Constitutional:      General: Johnjoseph is not in acute distress. Cardiovascular:     Rate and Rhythm: Normal rate and regular rhythm.     Pulses: Normal pulses.     Heart sounds: Normal heart sounds.  Pulmonary:     Effort: Pulmonary effort is normal.     Breath sounds: Normal breath sounds.  Musculoskeletal:     Cervical back: Normal.     Thoracic back: Tenderness and bony tenderness present. No swelling, deformity, signs of trauma or spasms. Normal range of motion. No scoliosis.     Lumbar back: Normal.       Back:  Skin:    General: Skin is warm and dry.     Findings: No rash.  Neurological:     Mental Status: Ralph is alert and oriented to person, place, and time.     No results found for any visits on 10/27/22.    Assessment & Plan:    Problem List Items Addressed This Visit     Gastro-esophageal reflux disease without esophagitis - Primary    Persistent throat clearing, lump in chest, chest tight with inspiration. No SOB, no wheezing, no exertional symptoms. Peak flow measurement: 500 x2 Current use of pantoprazole 20mg  every day  Increase PPI to 20mg  BID Consider resuming anxiety med if no improvement and F/up with GI      Relevant Medications   pantoprazole (PROTONIX) 20 MG tablet   Thoracic spine pain    Onset 62month ago, located-mid upper back, dull, constant, no specific trigger  or associated symptoms, activity or ROM is not restricted, does not interfere with sleep. Denies any injury, no rash, no SOB, no nausea, no ABDOMEN pain, no change in appetite Weight loss noted-intentional Exercise regimen: walking daily, denies ay weight training exercise.  Compression fracture vs herniated disc vs AS vs muscle strain? Get x-ray thoracic spine Consider outpatient PT if normal x-ray      Relevant Orders   DG Thoracic Spine W/Swimmers   Return in about 2 months (around 12/27/2022) for CPE (fasting).     Alysia Penna, NP

## 2022-10-27 NOTE — Assessment & Plan Note (Addendum)
Persistent throat clearing, lump in chest, chest tight with inspiration. No SOB, no wheezing, no exertional symptoms. Peak flow measurement: 500 x2 Current use of pantoprazole 20mg  every day  Increase PPI to 20mg  BID Consider resuming anxiety med if no improvement and F/up with GI

## 2022-10-27 NOTE — Assessment & Plan Note (Addendum)
Onset 3month ago, located-mid upper back, dull, constant, no specific trigger or associated symptoms, activity or ROM is not restricted, does not interfere with sleep. Denies any injury, no rash, no SOB, no nausea, no ABDOMEN pain, no change in appetite Weight loss noted-intentional Exercise regimen: walking daily, denies ay weight training exercise.  Compression fracture vs herniated disc vs AS vs muscle strain? Get x-ray thoracic spine Consider outpatient PT if normal x-ray

## 2022-10-28 NOTE — Addendum Note (Signed)
Addended by: Michaela Corner on: 10/28/2022 06:48 PM   Modules accepted: Orders

## 2022-10-29 ENCOUNTER — Telehealth: Payer: Self-pay | Admitting: Nurse Practitioner

## 2022-10-29 NOTE — Telephone Encounter (Signed)
PAPERWORK/FORMS received   CLINICAL USE BELOW THIS LINE (use X to signify action taken)  _X_ Form received and placed in providers office for signature. ___ Form completed and faxed to LOA Dept.  ___ Form completed & LVM to notify patient ready for pick up.  ___ Charge sheet and copy of form in front office folder for office supervisor.

## 2022-10-29 NOTE — Telephone Encounter (Signed)
Patient dropped off document  ptsd information , to be filled out by provider. Patient requested to send it back via Call Patient to pick up within 5-days. Document is located in providers tray at front office.Please advise at Mobile [phone] (mobile)   Pt walked in with form . I put in the dr box

## 2022-11-13 ENCOUNTER — Ambulatory Visit: Payer: Managed Care, Other (non HMO)
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
## 2022-11-23 ENCOUNTER — Ambulatory Visit: Payer: Managed Care, Other (non HMO) | Attending: Nurse Practitioner | Admitting: Physical Therapy

## 2022-11-23 ENCOUNTER — Encounter: Payer: Self-pay | Admitting: Physical Therapy

## 2022-11-23 DIAGNOSIS — M546 Pain in thoracic spine: Secondary | ICD-10-CM | POA: Insufficient documentation

## 2022-11-23 DIAGNOSIS — R293 Abnormal posture: Secondary | ICD-10-CM | POA: Diagnosis present

## 2022-11-23 DIAGNOSIS — M6281 Muscle weakness (generalized): Secondary | ICD-10-CM | POA: Diagnosis present

## 2022-11-23 DIAGNOSIS — Z7409 Other reduced mobility: Secondary | ICD-10-CM | POA: Insufficient documentation

## 2022-11-23 DIAGNOSIS — M5134 Other intervertebral disc degeneration, thoracic region: Secondary | ICD-10-CM | POA: Insufficient documentation

## 2022-11-23 DIAGNOSIS — M2560 Stiffness of unspecified joint, not elsewhere classified: Secondary | ICD-10-CM | POA: Diagnosis present

## 2022-12-01 ENCOUNTER — Ambulatory Visit: Payer: Managed Care, Other (non HMO) | Admitting: Physical Therapy

## 2022-12-02 ENCOUNTER — Encounter: Payer: Self-pay | Admitting: Nurse Practitioner

## 2022-12-02 ENCOUNTER — Ambulatory Visit (INDEPENDENT_AMBULATORY_CARE_PROVIDER_SITE_OTHER): Payer: Managed Care, Other (non HMO) | Admitting: Nurse Practitioner

## 2022-12-02 VITALS — BP 110/72 | HR 79 | Temp 98.0°F | Ht 70.0 in | Wt 225.8 lb

## 2022-12-02 DIAGNOSIS — F411 Generalized anxiety disorder: Secondary | ICD-10-CM | POA: Diagnosis not present

## 2022-12-02 DIAGNOSIS — Z0000 Encounter for general adult medical examination without abnormal findings: Secondary | ICD-10-CM | POA: Diagnosis not present

## 2022-12-02 DIAGNOSIS — Z23 Encounter for immunization: Secondary | ICD-10-CM | POA: Diagnosis not present

## 2022-12-02 NOTE — Progress Notes (Signed)
Complete physical exam  Patient: Shane Munoz   DOB: 1981/02/08   42 y.o. Adult  MRN: [id number] Visit Date: 12/02/2022  Subjective:    Chief Complaint  Patient presents with   Annual Exam    Fasting   Shane Munoz is a 42 y.o. adult who presents today for a complete physical exam. Shane Munoz reports consuming a low fat and low sodium diet.  Walking 1hr daily  Shane Munoz generally feels well. Shane Munoz reports sleeping well. Shane Munoz does not have additional problems to discuss today.  Vision:No Dental:Yes STD Screen:No PSA:No BP Readings from Last 3 Encounters:  12/02/22 110/72  10/27/22 110/80  09/28/22 126/84   Wt Readings from Last 3 Encounters:  12/02/22 225 lb 12.8 oz (102.4 kg)  10/27/22 227 lb (103 kg)  09/28/22 238 lb (108 kg)   Most recent fall risk assessment:    12/02/2022    1:08 PM  Fall Risk   Falls in the past year? 1  Number falls in past yr: 0  Injury with Fall? 0  Risk for fall due to : No Fall Risks  Follow up Falls evaluation completed   Depression screen:Yes - Depression Most recent depression screenings:    12/02/2022    1:09 PM 04/23/2022    8:29 AM  PHQ 2/9 Scores  PHQ - 2 Score 0 0  PHQ- 9 Score 3    HPI  GAD (generalized anxiety disorder) Stable mood Unable to schedule frequent appts with counselor, so plans to schedule appointment with EAP counselor. Chooses to remain off effexor at this time Current use of bactrim for acute epidermitis.   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   GERD (gastroesophageal reflux disease)    Hyperlipidemia    Laryngopharyngeal reflux (LPR) 12/20/2019   PTSD (post-traumatic stress disorder)    Substance abuse (HCC)    9 yrs ago/ "all substances"   Past Surgical History:  Procedure Laterality Date   MIDDLE EAR SURGERY     tube inside   Social History    Socioeconomic History   Marital status: Married    Spouse name: Not on file   Number of children: Not on file   Years of education: Not on file   Highest education level: Associate degree: occupational, Scientist, product/process development, or vocational program  Occupational History   Not on file  Tobacco Use   Smoking status: Never   Smokeless tobacco: Never  Vaping Use   Vaping status: Never Used  Substance and Sexual Activity   Alcohol use: Not Currently    Alcohol/week: 2.0 standard drinks of alcohol    Types: 2 Cans of beer per week    Comment: social   Drug use: Not Currently    Types: Marijuana    Comment: clean for 9 years   Sexual activity: Yes    Birth control/protection: None  Other Topics Concern   Not on file  Social History Narrative   Not on file   Social Determinants of Health   Financial Resource Strain: Medium Risk (09/26/2022)   Overall Financial Resource Strain (CARDIA)    Difficulty of Paying Living Expenses: Somewhat hard  Food Insecurity: No Food Insecurity (09/26/2022)   Hunger Vital Sign    Worried About Running Out of Food in the Last Year: Never true    Ran Out of Food in  the Last Year: Never true  Transportation Needs: No Transportation Needs (09/26/2022)   PRAPARE - Administrator, Civil Service (Medical): No    Lack of Transportation (Non-Medical): No  Physical Activity: Sufficiently Active (09/26/2022)   Exercise Vital Sign    Days of Exercise per Week: 4 days    Minutes of Exercise per Session: 60 min  Stress: No Stress Concern Present (09/26/2022)   [employer] of Occupational Health - Occupational Stress Questionnaire    Feeling of Stress : Only a little  Social Connections: Socially Integrated (09/26/2022)   Social Connection and Isolation Panel [NHANES]    Frequency of Communication with Friends and Family: Twice a week    Frequency of Social Gatherings with Friends and Family: Three times a week    Attends Religious Services: 1 to 4 times  per year    Active Member of Clubs or Organizations: Yes    Attends Engineer, structural: More than 4 times per year    Marital Status: Married  Catering manager Violence: Not on file   Family Status  Relation Name Status   Mother [place] Cooper Alive   Father Charm Barges Deceased   MGM  Deceased   MGF  Deceased   Neg Hx  (Not Specified)  No partnership data on file   Family History  Problem Relation Age of Onset   Obesity Mother    Heart disease Father 68   Diabetes Father    Obesity Father    Stroke Maternal Grandmother 16   Heart disease Maternal Grandfather 29   Colon cancer Neg Hx    Esophageal cancer Neg Hx    Liver cancer Neg Hx    Pancreatic cancer Neg Hx    Rectal cancer Neg Hx    Allergies  Allergen Reactions   Metoprolol Palpitations    Patient Care Team: Corbett Moulder, Bonna Gains, NP as PCP - General (Internal Medicine) [name], MD as PCP - Cardiology (Cardiology)   Medications: Outpatient Medications Prior to Visit  Medication Sig   amLODipine (NORVASC) 5 MG tablet Take 1 tablet (5 mg total) by mouth at bedtime.   EPINEPHrine 0.3 mg/0.3 mL IJ SOAJ injection See admin instructions.   fenofibrate (TRICOR) 145 MG tablet TAKE ONE TABLET BY MOUTH ONE TIME DAILY   levocetirizine (XYZAL) 5 MG tablet Take 5 mg by mouth every evening.   pantoprazole (PROTONIX) 20 MG tablet Take 1 tablet (20 mg total) by mouth 2 (two) times daily.   sulfamethoxazole-trimethoprim (BACTRIM DS) 800-160 MG tablet Take by mouth.   valACYclovir (VALTREX) 1000 MG tablet TAKE 2 TABLETS(2000 MG) BY MOUTH TWICE DAILY FOR 1 DAY   Vitamin D, Ergocalciferol, (DRISDOL) 1.25 MG (50000 UNIT) CAPS capsule Take 1 capsule (50,000 Units total) by mouth every 7 (seven) days.   No facility-administered medications prior to visit.   Review of Systems  Constitutional:  Negative for activity change, appetite change and unexpected weight change.  Respiratory: Negative.     Cardiovascular: Negative.   Gastrointestinal: Negative.   Endocrine: Negative for cold intolerance and heat intolerance.  Genitourinary: Negative.   Musculoskeletal: Negative.   Skin: Negative.   Neurological: Negative.   Hematological: Negative.   Psychiatric/Behavioral:  Negative for behavioral problems, decreased concentration, dysphoric mood, hallucinations, self-injury, sleep disturbance and suicidal ideas. The patient is not nervous/anxious.        Objective:  BP 110/72 (BP Location: Left Arm, Patient Position: Sitting, Cuff Size: Large)   Pulse 79  Temp 98 F (36.7 C) (Temporal)   Ht 5\' 10"  (1.778 m)   Wt 225 lb 12.8 oz (102.4 kg)   SpO2 98%   BMI 32.40 kg/m     Physical Exam Vitals and nursing note reviewed.  Constitutional:      General: Seth is not in acute distress. HENT:     Right Ear: Tympanic membrane, ear canal and external ear normal.     Left Ear: Tympanic membrane, ear canal and external ear normal.     Nose: Nose normal.  Eyes:     Extraocular Movements: Extraocular movements intact.     Conjunctiva/sclera: Conjunctivae normal.     Pupils: Pupils are equal, round, and reactive to light.  Neck:     Thyroid: No thyroid mass, thyromegaly or thyroid tenderness.  Cardiovascular:     Rate and Rhythm: Normal rate and regular rhythm.     Pulses: Normal pulses.     Heart sounds: Normal heart sounds.  Pulmonary:     Effort: Pulmonary effort is normal.     Breath sounds: Normal breath sounds.  Abdominal:     General: Bowel sounds are normal.     Palpations: Abdomen is soft.  Musculoskeletal:        General: Normal range of motion.     Cervical back: Normal range of motion and neck supple.     Right lower leg: No edema.     Left lower leg: No edema.  Lymphadenopathy:     Cervical: No cervical adenopathy.  Skin:    General: Skin is warm and dry.  Neurological:     Mental Status: Woods is alert and oriented to person, place, and time.      Cranial Nerves: No cranial nerve deficit.  Psychiatric:        Mood and Affect: Mood normal.        Behavior: Behavior normal.        Thought Content: Thought content normal.     No results found for any visits on 12/02/22.    Assessment & Plan:    Routine Health Maintenance and Physical Exam  Immunization History  Administered Date(s) Administered   Influenza Inj Mdck Quad Pf 12/24/2016   Influenza Split 12/24/2016, 12/07/2017, 11/22/2018, 12/05/2019   Influenza, High Dose Seasonal PF 01/02/2020, 05/16/2021   Influenza, Seasonal, Injecte, Preservative Fre 12/02/2022   Influenza,inj,Quad PF,6+ Mos 12/07/2017, 11/22/2018, 12/05/2019, 11/13/2020   Influenza-Unspecified 12/24/2016, 12/07/2017, 11/22/2018, 12/05/2019, 12/08/2021   PFIZER(Purple Top)SARS-COV-2 Vaccination 05/29/2019, 06/26/2019, 12/24/2019, 01/02/2020, 05/16/2021   Tdap 07/12/2017   Health Maintenance  Topic Date Due   Hepatitis C Screening  Never done   COVID-19 Vaccine (6 - 2023-24 season) 12/18/2022 (Originally 11/08/2022)   Cervical Cancer Screening (HPV/Pap Cotest)  12/02/2023 (Originally 10/03/2008)   DTaP/Tdap/Td (2 - Td or Tdap) 07/13/2027   INFLUENZA VACCINE  Completed   HIV Screening  Completed   HPV VACCINES  Aged Out   Discussed health benefits of physical activity, and encouraged Elver to engage in regular exercise appropriate for Carsten's age and condition. Advised t get updated COVID vaccine. We discussed previous lab results: lipid panel and CMP. We discussed importance of heart healthy diet/meditarranean diet. We plan to repeat labs in 6months (fasting)  Problem List Items Addressed This Visit     GAD (generalized anxiety disorder)    Stable mood Unable to schedule frequent appts with counselor, so plans to schedule appointment with EAP counselor. Chooses to remain off effexor at this time  Other Visit Diagnoses     Preventative health care    -  Primary   Immunization due        Relevant Orders   Flu vaccine trivalent PF, 6mos and older(Flulaval,Afluria,Fluarix,Fluzone) (Completed)      Return in about 6 months (around 06/01/2023) for prediabetes, vit. D, hyperlipidemia (fasting).     Alysia Penna, NP

## 2022-12-02 NOTE — Patient Instructions (Signed)
Continue Heart healthy diet and daily exercise. Maintain current medications.  Preventive Care 44-44 Years Old, Male Preventive care refers to lifestyle choices and visits with your health care provider that can promote health and wellness. Preventive care visits are also called wellness exams. What can I expect for my preventive care visit? Counseling During your preventive care visit, your health care provider may ask about your: Medical history, including: Past medical problems. Family medical history. Current health, including: Emotional well-being. Home life and relationship well-being. Sexual activity. Lifestyle, including: Alcohol, nicotine or tobacco, and drug use. Access to firearms. Diet, exercise, and sleep habits. Safety issues such as seatbelt and bike helmet use. Sunscreen use. Work and work Astronomer. Physical exam Your health care provider will check your: Height and weight. These may be used to calculate your BMI (body mass index). BMI is a measurement that tells if you are at a healthy weight. Waist circumference. This measures the distance around your waistline. This measurement also tells if you are at a healthy weight and may help predict your risk of certain diseases, such as type 2 diabetes and high blood pressure. Heart rate and blood pressure. Body temperature. Skin for abnormal spots. What immunizations do I need?  Vaccines are usually given at various ages, according to a schedule. Your health care provider will recommend vaccines for you based on your age, medical history, and lifestyle or other factors, such as travel or where you work. What tests do I need? Screening Your health care provider may recommend screening tests for certain conditions. This may include: Lipid and cholesterol levels. Diabetes screening. This is done by checking your blood sugar (glucose) after you have not eaten for a while (fasting). Hepatitis B test. Hepatitis C  test. HIV (human immunodeficiency virus) test. STI (sexually transmitted infection) testing, if you are at risk. Lung cancer screening. Prostate cancer screening. Colorectal cancer screening. Talk with your health care provider about your test results, treatment options, and if necessary, the need for more tests. Follow these instructions at home: Eating and drinking  Eat a diet that includes fresh fruits and vegetables, whole grains, lean protein, and low-fat dairy products. Take vitamin and mineral supplements as recommended by your health care provider. Do not drink alcohol if your health care provider tells you not to drink. If you drink alcohol: Limit how much you have to 0-2 drinks a day. Know how much alcohol is in your drink. In the [place] one drink equals one 12 oz bottle of beer (355 mL), one 5 oz glass of wine (148 mL), or one 1 oz glass of hard liquor (44 mL). Lifestyle Brush your teeth every morning and night with fluoride toothpaste. Floss one time each day. Exercise for at least 30 minutes 5 or more days each week. Do not use any products that contain nicotine or tobacco. These products include cigarettes, chewing tobacco, and vaping devices, such as e-cigarettes. If you need help quitting, ask your health care provider. Do not use drugs. If you are sexually active, practice safe sex. Use a condom or other form of protection to prevent STIs. Take aspirin only as told by your health care provider. Make sure that you understand how much to take and what form to take. Work with your health care provider to find out whether it is safe and beneficial for you to take aspirin daily. Find healthy ways to manage stress, such as: Meditation, yoga, or listening to music. Journaling. Talking to a trusted person. Spending time  with friends and family. Minimize exposure to UV radiation to reduce your risk of skin cancer. Safety Always wear your seat belt while driving or riding in a  vehicle. Do not drive: If you have been drinking alcohol. Do not ride with someone who has been drinking. When you are tired or distracted. While texting. If you have been using any mind-altering substances or drugs. Wear a helmet and other protective equipment during sports activities. If you have firearms in your house, make sure you follow all gun safety procedures. What's next? Go to your health care provider once a year for an annual wellness visit. Ask your health care provider how often you should have your eyes and teeth checked. Stay up to date on all vaccines. This information is not intended to replace advice given to you by your health care provider. Make sure you discuss any questions you have with your health care provider. Document Revised: 08/21/2020 Document Reviewed: 08/21/2020 Elsevier Patient Education  2024 [employer].

## 2022-12-02 NOTE — Assessment & Plan Note (Signed)
Stable mood Unable to schedule frequent appts with counselor, so plans to schedule appointment with EAP counselor. Chooses to remain off effexor at this time

## 2022-12-08 ENCOUNTER — Ambulatory Visit: Payer: Managed Care, Other (non HMO) | Admitting: Physical Therapy

## 2022-12-15 ENCOUNTER — Ambulatory Visit: Payer: Managed Care, Other (non HMO) | Admitting: Physical Therapy

## 2022-12-16 ENCOUNTER — Encounter: Payer: Self-pay | Admitting: Nurse Practitioner

## 2022-12-17 ENCOUNTER — Encounter: Payer: Self-pay | Admitting: Physician Assistant

## 2022-12-17 ENCOUNTER — Ambulatory Visit: Payer: Managed Care, Other (non HMO) | Admitting: Physician Assistant

## 2022-12-17 VITALS — BP 100/74 | HR 56 | Ht 69.5 in | Wt 226.2 lb

## 2022-12-17 DIAGNOSIS — R1084 Generalized abdominal pain: Secondary | ICD-10-CM

## 2022-12-17 DIAGNOSIS — K219 Gastro-esophageal reflux disease without esophagitis: Secondary | ICD-10-CM | POA: Diagnosis not present

## 2022-12-17 DIAGNOSIS — R194 Change in bowel habit: Secondary | ICD-10-CM

## 2022-12-17 MED ORDER — NA SULFATE-K SULFATE-MG SULF 17.5-3.13-1.6 GM/177ML PO SOLN: 1.0000 | Freq: Once | ORAL | 0 refills | Status: AC

## 2022-12-17 NOTE — Patient Instructions (Signed)
You have been scheduled for a colonoscopy. Please follow written instructions given to you at your visit today.   Please pick up your prep supplies at the pharmacy within the next 1-3 days.  If you use inhalers (even only as needed), please bring them with you on the day of your procedure.  DO NOT TAKE 7 DAYS PRIOR TO TEST- Trulicity (dulaglutide) Ozempic, Wegovy (semaglutide) Mounjaro (tirzepatide) Bydureon Bcise (exanatide extended release)  DO NOT TAKE 1 DAY PRIOR TO YOUR TEST Rybelsus (semaglutide) Adlyxin (lixisenatide) Victoza (liraglutide) Byetta (exanatide)  _______________________________________________________  If your blood pressure at your visit was 140/90 or greater, please contact your primary care physician to follow up on this.  _______________________________________________________  If you are age 60 or older, your body mass index should be between 23-30. Your Body mass index is 32.93 kg/m. If this is out of the aforementioned range listed, please consider follow up with your Primary Care Provider.  If you are age 41 or younger, your body mass index should be between 19-25. Your Body mass index is 32.93 kg/m. If this is out of the aformentioned range listed, please consider follow up with your Primary Care Provider.   ________________________________________________________  The [facility] providers would like to encourage you to use [facility] to communicate with providers for non-urgent requests or questions.  Due to long hold times on the telephone, sending your provider a message by [facility] may be a faster and more efficient way to get a response.  Please allow 48 business hours for a response.  Please remember that this is for non-urgent requests.   It was a pleasure to see you today!  Thank you for trusting me with your gastrointestinal care!    Hyacinth Meeker, PA-C

## 2022-12-17 NOTE — Progress Notes (Signed)
Chief Complaint: LLQ pain  HPI:    [name] is a 42 year old male with a past medical history, known to Dr. Myrtie Neither, as listed below including alcohol addiction, GERD, PTSD and multiple others, who was referred to me by Nche, Bonna Gains, NP for a complaint of lower quadrant pain.      11/23/2019 EGD was normal.    09/28/2022 hepatic function panel, BMP, hemoglobin A1c, PSA and CBC.    10/02/2022 CT of the abdomen pelvis with contrast for left lower quadrant pain was negative with no evidence of diverticulitis or other acute findings.    10/27/2022 x-ray of the thoracic spine with minimal degenerative changes.    10/27/2022 office visit with PCP for GERD.  At that time persistent throat clearing, lump in chest and chest tight with inspiration.  Was using Pantoprazole 20 mg daily which was increased to twice daily.  Discussed possibly resuming anxiety med if no improvement.    Today, patient presents to clinic and tells me that he knows something is going on in his digestive system.  Slightly more urgent and he has 2-3 that are clustered together in the morning and then none throughout the rest of the day.  Has also noticed abdominal pain sometimes severe before a bowel movement that is better afterwards as well as a left lower quadrant pain that he experienced for a few months that was severe and now has dissipated.  He did have a CT as above which was normal.  Describes some cramping sensation in his rectum that radiates upwards helped by Ibuprofen at times.  Apparently just treated for prostatitis with antibiotics and is not sure this made much of a difference.    Does have a history of PTSD and a complicated social history which could be contributing to symptoms.    Denies fever, chills, weight loss or blood in his stool.  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 90 tablet 1   EPINEPHrine 0.3 mg/0.3 mL IJ SOAJ injection See admin instructions.     fenofibrate (TRICOR) 145 MG tablet TAKE ONE TABLET BY MOUTH ONE TIME DAILY 90 tablet 1   levocetirizine (XYZAL) 5 MG tablet Take 5 mg by mouth every evening.     pantoprazole (PROTONIX) 20 MG tablet Take 1 tablet (20 mg total) by mouth 2 (two) times daily. 60 tablet 5   valACYclovir (VALTREX) 1000 MG tablet TAKE 2 TABLETS(2000 MG) BY MOUTH TWICE DAILY FOR 1 DAY 4 tablet 2   Vitamin D, Ergocalciferol, (DRISDOL) 1.25 MG (50000 UNIT) CAPS capsule Take 1 capsule (50,000 Units total) by mouth every 7 (seven) days. 12 capsule 1   No current facility-administered medications for this visit.    Allergies as of 12/17/2022 - Review Complete 12/02/2022  Allergen Reaction Noted   Metoprolol Palpitations 08/24/2019  Family History  Problem Relation Age of Onset   Obesity Mother    Heart disease Father 71   Diabetes Father    Obesity Father    Stroke Maternal Grandmother 60   Heart disease Maternal Grandfather 45   Colon cancer Neg Hx    Esophageal cancer Neg Hx    Liver cancer Neg Hx    Pancreatic cancer Neg Hx    Rectal cancer Neg Hx     Social History   Socioeconomic History   Marital status: Married    Spouse name: Not on file   Number of children: Not on file   Years of education: Not on file   Highest education level: Associate degree: occupational, Scientist, product/process development, or vocational program  Occupational History   Not on file  Tobacco Use    Smoking status: Never   Smokeless tobacco: Never  Vaping Use   Vaping status: Never Used  Substance and Sexual Activity   Alcohol use: Not Currently    Alcohol/week: 2.0 standard drinks of alcohol    Types: 2 Cans of beer per week    Comment: social   Drug use: Not Currently    Types: Marijuana    Comment: clean for 9 years   Sexual activity: Yes    Birth control/protection: None  Other Topics Concern   Not on file  Social History Narrative   Not on file   Social Determinants of Health   Financial Resource Strain: Medium Risk (09/26/2022)   Overall Financial Resource Strain (CARDIA)    Difficulty of Paying Living Expenses: Somewhat hard  Food Insecurity: No Food Insecurity (09/26/2022)   Hunger Vital Sign    Worried About Running Out of Food in the Last Year: Never true    Ran Out of Food in the Last Year: Never true  Transportation Needs: No Transportation Needs (09/26/2022)   PRAPARE - Administrator, Civil Service (Medical): No    Lack of Transportation (Non-Medical): No  Physical Activity: Sufficiently Active (09/26/2022)   Exercise Vital Sign    Days of Exercise per Week: 4 days    Minutes of Exercise per Session: 60 min  Stress: No Stress Concern Present (09/26/2022)   [employer] of Occupational Health - Occupational Stress Questionnaire    Feeling of Stress : Only a little  Social Connections: Socially Integrated (09/26/2022)   Social Connection and Isolation Panel [NHANES]    Frequency of Communication with Friends and Family: Twice a week    Frequency of Social Gatherings with Friends and Family: Three times a week    Attends Religious Services: 1 to 4 times per year    Active Member of Clubs or Organizations: Yes    Attends Engineer, structural: More than 4 times per year    Marital Status: Married  Catering manager Violence: Not on file    Review of Systems:    Constitutional: No weight loss, fever or chills Skin: No  rash Cardiovascular: No chest pain  Respiratory: No SOB  Gastrointestinal: See HPI and otherwise negative Genitourinary: No dysuria Neurological: No headache, dizziness or syncope Musculoskeletal: No new muscle or joint pain Hematologic: No bleeding  Psychiatric: See HPI   Physical Exam:  Vital signs: BP 100/74 (BP Location: Left Arm, Patient Position: Sitting, Cuff Size: Normal)   Pulse (!) 56   Ht 5' 9.5" (1.765 m) Comment: height measured without shoes  Wt 226 lb 4 oz (102.6 kg)   BMI 32.93 kg/m  Constitutional:   Pleasant Caucasian person appears to be in NAD, Well developed, Well nourished, alert and cooperative Head:  Normocephalic and atraumatic. Eyes:   PEERL, EOMI. No icterus. Conjunctiva pink. Ears:  Normal auditory acuity. Neck:  Supple Throat: Oral cavity and pharynx without inflammation, swelling or lesion.  Respiratory: Respirations even and unlabored. Lungs clear to auscultation bilaterally.   No wheezes, crackles, or rhonchi.  Cardiovascular: Normal S1, S2. No MRG. Regular rate and rhythm. No peripheral edema, cyanosis or pallor.  Gastrointestinal:  Soft, nondistended, Mild LLQ and Epigastric ttp, No rebound or guarding. Normal bowel sounds. No appreciable masses or hepatomegaly. Rectal:  Not performed.  Msk:  Symmetrical without gross deformities. Without edema, no deformity or joint abnormality.  Neurologic:  Alert and  oriented x4;  grossly normal neurologically.  Skin:   Dry and intact without significant lesions or rashes. Psychiatric:  Demonstrates good judgement and reason without abnormal affect or behaviors.  RELEVANT LABS AND IMAGING: CBC    Component Value Date/Time   WBC 5.5 09/28/2022 1403   RBC 5.26 09/28/2022 1403   HGB 14.9 09/28/2022 1403   HCT 44.8 09/28/2022 1403   PLT 280.0 09/28/2022 1403   MCV 85.2 09/28/2022 1403   MCH 29.0 08/21/2019 0112   MCHC 33.2 09/28/2022 1403   RDW 12.7 09/28/2022 1403   LYMPHSABS 1.7 09/28/2022 1403    MONOABS 0.4 09/28/2022 1403   EOSABS 0.1 09/28/2022 1403   BASOSABS 0.0 09/28/2022 1403    CMP     Component Value Date/Time   NA 141 09/28/2022 1403   K 4.1 09/28/2022 1403   CL 103 09/28/2022 1403   CO2 26 09/28/2022 1403   GLUCOSE 96 09/28/2022 1403   BUN 13 09/28/2022 1403   CREATININE 0.93 09/28/2022 1403   CREATININE 0.93 08/30/2018 1353   CALCIUM 9.6 09/28/2022 1403   PROT 6.5 09/28/2022 1403   ALBUMIN 4.5 09/28/2022 1403   AST 14 09/28/2022 1403   ALT 22 09/28/2022 1403   ALKPHOS 73 09/28/2022 1403   BILITOT 0.5 09/28/2022 1403   GFRNONAA >60 08/21/2019 0112   GFRAA >60 08/21/2019 0112    Assessment: 1.  Abdominal pain: Sometimes severe before a bowel movement, sometimes more in the left lower quadrant other times not, recent CT was normal, labs normal, has been off of anxiety meds since May and symptoms did seem to worsen after that; consider IBS versus other 2.  Change in bowel habits: Slightly more urgent and clustered together in the morning accompanied by some abdominal pain; consider BS versus other 3.  GERD: Controlled with Pantoprazole 20 twice daily, previous EGD was normal in 2021  Plan: 1.  Scheduled patient for diagnostic colonoscopy in the [facility] with Dr. Myrtie Neither.  Did provide the patient a detailed list of risks for the procedures and he agrees to proceed. Patient is appropriate for endoscopic procedure(s) in the ambulatory ([facility]) setting.   2.  Continue Pantoprazole 20 mg twice daily 3.  Discussed with patient that if colonoscopy is normal would recommend that he restart his anxiety meds as they may be helpful in this setting. 4.  Patient will follow in clinic per recommendations after time of procedure.  Hyacinth Meeker, PA-C [facility] 12/17/2022, 9:08 AM  Cc: [name], NP

## 2022-12-18 ENCOUNTER — Ambulatory Visit: Payer: Managed Care, Other (non HMO) | Attending: Internal Medicine

## 2022-12-18 ENCOUNTER — Encounter: Payer: Self-pay | Admitting: Internal Medicine

## 2022-12-18 ENCOUNTER — Ambulatory Visit: Payer: Managed Care, Other (non HMO) | Attending: Internal Medicine | Admitting: Internal Medicine

## 2022-12-18 VITALS — BP 110/70 | HR 62 | Ht 70.0 in | Wt 229.0 lb

## 2022-12-18 DIAGNOSIS — E785 Hyperlipidemia, unspecified: Secondary | ICD-10-CM | POA: Diagnosis not present

## 2022-12-18 DIAGNOSIS — R001 Bradycardia, unspecified: Secondary | ICD-10-CM

## 2022-12-18 DIAGNOSIS — K219 Gastro-esophageal reflux disease without esophagitis: Secondary | ICD-10-CM | POA: Diagnosis not present

## 2022-12-18 DIAGNOSIS — I10 Essential (primary) hypertension: Secondary | ICD-10-CM

## 2022-12-18 NOTE — Progress Notes (Unsigned)
Enrolled patient for a 7 day Zio XT monitor to be mailed to patients home.  

## 2022-12-18 NOTE — Progress Notes (Signed)
Cardiology Office Note:  .   Date:  12/18/2022  ID:  Shane Munoz, DOB 04/04/1980, MRN [id number] PCP: [name], NP  [facility] Providers Cardiologist:  [name], MD    History of Present Illness: .   Shane Munoz is a 42 y.o. adult.  Discussed the use of AI scribe software for clinical note transcription with the patient, who gave verbal consent to proceed.  History of Present Illness   The patient, a 42 year old non-binary individual, initially presented in June 2021 with chest pain. 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 The patient's current concern is sinus bradycardia, with a heart rate in the 50s, lower than their usual resting heart rate in the 60s. They report feeling extra fatigued, but deny feeling like they are going to pass out.  The patient has been experiencing gastrointestinal issues and is under the care of a gastroenterologist. They are scheduled for a diagnostic colonoscopy. They are currently taking pantoprazole 20mg  twice daily for their GI symptoms. The patient has a history of tachycardia and a hypertensive crisis. They have stopped drinking alcohol and have lost 18 pounds since the onset of their symptoms. They also report feeling lightheaded once, which they attribute to coming off venlafaxine.        ROS: negative except per HPI above.  Studies Reviewed: Marland Kitchen   EKG Interpretation Date/Time:  Friday December 18 2022 14:44:49 EDT Ventricular Rate:  62 PR Interval:  174 QRS Duration:  90 QT Interval:  426 QTC Calculation: 432 R Axis:   -27  Text Interpretation: Normal sinus rhythm Normal ECG Confirmed by Weston Brass ([id number]) on 12/18/2022 3:01:13 PM    Results   RADIOLOGY Coronary CTA: No coronary artery calcifications, no coronary artery disease, dilation of the ascending aorta 44 mm at the  sinus of Valsalva (08/2019)  DIAGNOSTIC Echocardiogram: EF 60-65%, normal diastolic function, normal RV function, normal cardiac valves (08/2019) EKG: Sinus bradycardia, heart rate 53 (09/28/2022)     Risk Assessment/Calculations:             Physical Exam:   VS:  BP 110/70   Pulse 62   Ht 5\' 10"  (1.778 m)   Wt 229 lb (103.9 kg)   SpO2 98%   BMI 32.86 kg/m    Wt Readings from Last 3 Encounters:  12/18/22 229 lb (103.9 kg)  12/17/22 226 lb 4 oz (102.6 kg)  12/02/22 225 lb 12.8 oz (102.4 kg)     Physical Exam   CHEST: Lung sounds clear. CARDIOVASCULAR: Heart sounds normal.     GEN: Well nourished, well developed in no acute distress NECK: No JVD; No carotid bruits CARDIAC: RRR, no murmurs, rubs, gallops RESPIRATORY:  Clear to auscultation without rales, wheezing or rhonchi  ABDOMEN: Soft, non-tender, non-distended EXTREMITIES:  No edema; No deformity   ASSESSMENT AND PLAN: .    1. Sinus bradycardia   2. HTN (hypertension), benign   3. Dyslipidemia   4. Gastro-esophageal reflux disease without esophagitis     Assessment and Plan    Sinus Bradycardia Noted on EKG with heart rate of 53. Patient reports lower heart rate than usual, mostly in the 50s, with associated fatigue during the day. 1 episode of syncope. Heart rate increases with exercise, but takes longer than usual to reach target range. No significant cardiac history. -Order 7 day cardiac monitor to  assess heart rate trends and rule out arrhythmias. -Order repeat echocardiogram to assess right ventricular size and overall cardiac function.  Gastrointestinal Issues Patient reports ongoing GI symptoms, currently managed with Pantoprazole 20mg  twice daily. Under care of gastroenterology with planned diagnostic colonoscopy. -Continue current GI management and follow up with gastroenterology as planned.  Hypertension Managed with Amlodipine 5mg  daily. -Continue current  management.  Hypertriglyceridemia Managed with Fenofibrate. -Continue current management.  Prediabetes -Monitor closely, encourage lifestyle modifications to prevent progression to diabetes.  Follow-up in 1 year if test results are normal. If any abnormalities are found on cardiac monitor or echocardiogram, patient will be brought back for further discussion and management.           Weston Brass, MD, [facility]  [facility]

## 2022-12-18 NOTE — Patient Instructions (Addendum)
Medication Instructions:  Your physician recommends that you continue on your current medications as directed. Please refer to the Current Medication list given to you today.  *If you need a refill on your cardiac medications before your next appointment, please call your pharmacy*   Testing/Procedures: Your physician has requested that you have an echocardiogram. Echocardiography is a painless test that uses sound waves to create images of your heart. It provides your doctor with information about the size and shape of your heart and how well your heart's chambers and valves are working. This procedure takes approximately one hour. There are no restrictions for this procedure. Please do NOT wear cologne, perfume, aftershave, or lotions (deodorant is allowed). Please arrive 15 minutes prior to your appointment time. This will take place at [address]    ZIO XT- Long Term Monitor Instructions  Your physician has requested you wear a ZIO patch monitor for 7 days.  This is a single patch monitor. [employer] supplies one patch monitor per enrollment. Additional stickers are not available. Please do not apply patch if you will be having a Nuclear Stress Test,  Echocardiogram, Cardiac CT, MRI, or Chest Xray during the period you would be wearing the  monitor. The patch cannot be worn during these tests. You cannot remove and re-apply the  ZIO XT patch monitor.  Your ZIO patch monitor will be mailed 3 day USPS to your address on file. It may take 3-5 days  to receive your monitor after you have been enrolled.  Once you have received your monitor, please review the enclosed instructions. Your monitor  has already been registered assigning a specific monitor serial # to you.  Billing and Patient Assistance Program Information  We have supplied [employer] with any of your insurance information on file for billing purposes. [employer] offers a sliding scale Patient Assistance Program for  patients that do not have  insurance, or whose insurance does not completely cover the cost of the ZIO monitor.  You must apply for the Patient Assistance Program to qualify for this discounted rate.  To apply, please call [employer] at [phone], select option 4, select option 2, ask to apply for  Patient Assistance Program. Meredeth Ide will ask your household income, and how many people  are in your household. They will quote your out-of-pocket cost based on that information.  [employer] will also be able to set up a 42-month, interest-free payment plan if needed.  Applying the monitor   Shave hair from upper left chest.  Hold abrader disc by orange tab. Rub abrader in 40 strokes over the upper left chest as  indicated in your monitor instructions.  Clean area with 4 enclosed alcohol pads. Let dry.  Apply patch as indicated in monitor instructions. Patch will be placed under collarbone on left  side of chest with arrow pointing upward.  Rub patch adhesive wings for 2 minutes. Remove white label marked "1". Remove the white  label marked "2". Rub patch adhesive wings for 2 additional minutes.  While looking in a mirror, press and release button in center of patch. A small green light will  flash 3-4 times. This will be your only indicator that the monitor has been turned on.  Do not shower for the first 24 hours. You may shower after the first 24 hours.  Press the button if you feel a symptom. You will hear a small click. Record Date, Time and  Symptom in the Patient Logbook.  When  you are ready to remove the patch, follow instructions on the last 2 pages of Patient  Logbook. Stick patch monitor onto the last page of Patient Logbook.  Place Patient Logbook in the blue and white box. Use locking tab on box and tape box closed  securely. The blue and white box has prepaid postage on it. Please place it in the mailbox as  soon as possible. Your physician should have your test results approximately  7 days after the  monitor has been mailed back to [facility].  Call [facility] Customer Care at [phone] if you have questions regarding  your ZIO XT patch monitor. Call them immediately if you see an orange light blinking on your  monitor.  If your monitor falls off in less than 4 days, contact our Monitor department at [phone].  If your monitor becomes loose or falls off after 4 days call [employer] at [phone] for  suggestions on securing your monitor    Follow-Up: At [facility], you and your health needs are our priority.  As part of our continuing mission to provide you with exceptional heart care, we have created designated Provider Care Teams.  These Care Teams include your primary Cardiologist (physician) and Advanced Practice Providers (APPs -  Physician Assistants and Nurse Practitioners) who all work together to provide you with the care you need, when you need it.  We recommend signing up for the patient portal called "MyChart".  Sign up information is provided on this After Visit Summary.  MyChart is used to connect with patients for Virtual Visits (Telemedicine).  Patients are able to view lab/test results, encounter notes, upcoming appointments, etc.  Non-urgent messages can be sent to your provider as well.   To learn more about what you can do with MyChart, go to [email or web address].    Your next appointment:   12 month(s)  Provider:   [name], MD

## 2022-12-20 NOTE — Progress Notes (Signed)
____________________________________________________________  Attending physician addendum:  Thank you for sending this case to me. I have reviewed the entire note and agree with the plan.  Colonoscopy certainly reasonable, though I still suspect this is likely to be functional abdominal pain.  Amada Jupiter, MD  ____________________________________________________________

## 2022-12-22 ENCOUNTER — Ambulatory Visit: Payer: Managed Care, Other (non HMO) | Admitting: Physical Therapy

## 2022-12-24 ENCOUNTER — Ambulatory Visit: Payer: Managed Care, Other (non HMO) | Admitting: Gastroenterology

## 2022-12-24 ENCOUNTER — Encounter: Payer: Self-pay | Admitting: Gastroenterology

## 2022-12-24 VITALS — BP 107/59 | HR 46 | Temp 98.4°F | Resp 13 | Ht 69.0 in | Wt 226.0 lb

## 2022-12-24 DIAGNOSIS — K5732 Diverticulitis of large intestine without perforation or abscess without bleeding: Secondary | ICD-10-CM | POA: Diagnosis present

## 2022-12-24 DIAGNOSIS — I10 Essential (primary) hypertension: Secondary | ICD-10-CM

## 2022-12-24 DIAGNOSIS — R001 Bradycardia, unspecified: Secondary | ICD-10-CM

## 2022-12-24 DIAGNOSIS — E785 Hyperlipidemia, unspecified: Secondary | ICD-10-CM | POA: Diagnosis not present

## 2022-12-24 DIAGNOSIS — R194 Change in bowel habit: Secondary | ICD-10-CM

## 2022-12-24 DIAGNOSIS — R1084 Generalized abdominal pain: Secondary | ICD-10-CM

## 2022-12-24 MED ORDER — SODIUM CHLORIDE 0.9 % IV SOLN: 500.0000 mL | Freq: Once | INTRAVENOUS | Status: DC

## 2022-12-24 MED ORDER — HYOSCYAMINE SULFATE 0.125 MG SL SUBL: 0.1250 mg | SUBLINGUAL_TABLET | Freq: Three times a day (TID) | SUBLINGUAL | 1 refills | Status: AC | PRN

## 2022-12-24 NOTE — Progress Notes (Signed)
Uneventful anesthetic. Report to pacu rn. Vss. Care resumed by rn. 

## 2022-12-24 NOTE — Patient Instructions (Signed)
YOU HAD AN ENDOSCOPIC PROCEDURE TODAY AT THE [facility]:   Refer to the procedure report that was given to you for any specific questions about what was found during the examination.  If the procedure report does not answer your questions, please call your gastroenterologist to clarify.  If you requested that your care partner not be given the details of your procedure findings, then the procedure report has been included in a sealed envelope for you to review at your convenience later.  YOU SHOULD EXPECT: Some feelings of bloating in the abdomen. Passage of more gas than usual.  Walking can help get rid of the air that was put into your GI tract during the procedure and reduce the bloating. If you had a lower endoscopy (such as a colonoscopy or flexible sigmoidoscopy) you may notice spotting of blood in your stool or on the toilet paper. If you underwent a bowel prep for your procedure, you may not have a normal bowel movement for a few days.  Please Note:  You might notice some irritation and congestion in your nose or some drainage.  This is from the oxygen used during your procedure.  There is no need for concern and it should clear up in a day or so.  SYMPTOMS TO REPORT IMMEDIATELY:  Following lower endoscopy (colonoscopy or flexible sigmoidoscopy):  Excessive amounts of blood in the stool  Significant tenderness or worsening of abdominal pains  Swelling of the abdomen that is new, acute  Fever of 100F or higher  For urgent or emergent issues, a gastroenterologist can be reached at any hour by calling [phone]. Do not use MyChart messaging for urgent concerns.    DIET:  We do recommend a small meal at first, but then you may proceed to your regular diet.  Drink plenty of fluids but you should avoid alcoholic beverages for 24 hours.  ACTIVITY:  You should plan to take it easy for the rest of today and you should NOT DRIVE or use heavy machinery until tomorrow (because of  the sedation medicines used during the test).    FOLLOW UP: Our staff will call the number listed on your records the next business day following your procedure.  We will call around 7:15- 8:00 am to check on you and address any questions or concerns that you may have regarding the information given to you following your procedure. If we do not reach you, we will leave a message.      SIGNATURES/CONFIDENTIALITY: You and/or your care partner have signed paperwork which will be entered into your electronic medical record.  These signatures attest to the fact that that the information above on your After Visit Summary has been reviewed and is understood.  Full responsibility of the confidentiality of this discharge information lies with you and/or your care-partner. 

## 2022-12-24 NOTE — Progress Notes (Signed)
No significant changes to clinical history since GI office visit on 12/17/22.  The patient is appropriate for an endoscopic procedure in the ambulatory setting.  - Amada Jupiter, MD

## 2022-12-24 NOTE — Op Note (Signed)
[facility] Patient Name: Shane Munoz Procedure Date: 12/24/2022 7:26 AM MRN: [id number] Endoscopist: Sherilyn Cooter L. Myrtie Neither , MD, [id number] Age: 42 Referring MD:  Date of Birth: 08-07-1980 Gender: Male Account #: [id number] Procedure:                Colonoscopy Indications:              Generalized abdominal pain, Change in bowel habits Medicines:                Monitored Anesthesia Care Procedure:                Pre-Anesthesia Assessment:                           - Prior to the procedure, a History and Physical                            was performed, and patient medications and                            allergies were reviewed. The patient's tolerance of                            previous anesthesia was also reviewed. The risks                            and benefits of the procedure and the sedation                            options and risks were discussed with the patient.                            All questions were answered, and informed consent                            was obtained. Prior Anticoagulants: The patient has                            taken no anticoagulant or antiplatelet agents. ASA                            Grade Assessment: II - A patient with mild systemic                            disease. After reviewing the risks and benefits,                            the patient was deemed in satisfactory condition to                            undergo the procedure.                           After obtaining informed consent, the colonoscope  was passed under direct vision. Throughout the                            procedure, the patient's blood pressure, pulse, and                            oxygen saturations were monitored continuously. The                            Olympus Scope SN: [id number] was introduced through                            the anus and advanced to the the terminal ileum,                            with  identification of the appendiceal orifice and                            IC valve. The colonoscopy was performed without                            difficulty. The patient tolerated the procedure                            well. The quality of the bowel preparation was                            excellent. The terminal ileum, ileocecal valve,                            appendiceal orifice, and rectum were photographed. Scope In: 8:08:54 AM Scope Out: 8:20:12 AM Scope Withdrawal Time: 0 hours 9 minutes 3 seconds  Total Procedure Duration: 0 hours 11 minutes 18 seconds  Findings:                 The perianal and digital rectal examinations were                            normal.                           The terminal ileum appeared normal.                           Repeat examination of right colon under NBI                            performed.                           A few diverticula were found in the left colon.                           The exam was otherwise without abnormality on  direct and retroflexion views. Complications:            No immediate complications. Estimated Blood Loss:     Estimated blood loss: none. Impression:               - The examined portion of the ileum was normal.                           - Diverticulosis in the left colon.                           - The examination was otherwise normal on direct                            and retroflexion views.                           - No specimens collected.                           Overall clinical picture is most consistent with                            IBS. Recommendation:           - Patient has a contact number available for                            emergencies. The signs and symptoms of potential                            delayed complications were discussed with the                            patient. Return to normal activities tomorrow.                            Written  discharge instructions were provided to the                            patient.                           - Resume previous diet.                           - Continue present medications.                           - Repeat colonoscopy in 10 years for screening                            purposes.                           - Use Levsin, NuLev (hyoscyamine) 0.125 mg 1 tab PO  q 8 hours PRN. Disp #45, RF 1                           - Return to this office as needed. Angeliki [name] L. Myrtie Neither, MD 12/24/2022 8:26:38 AM This report has been signed electronically.

## 2022-12-24 NOTE — Progress Notes (Signed)
Pt's states no medical or surgical changes since previsit or office visit. 

## 2022-12-25 ENCOUNTER — Telehealth: Payer: Self-pay

## 2022-12-25 NOTE — Telephone Encounter (Signed)
Left message on answering machine. 

## 2022-12-29 ENCOUNTER — Ambulatory Visit: Payer: Managed Care, Other (non HMO) | Admitting: Physical Therapy

## 2022-12-31 ENCOUNTER — Encounter: Payer: Self-pay | Admitting: Nurse Practitioner

## 2022-12-31 ENCOUNTER — Ambulatory Visit (INDEPENDENT_AMBULATORY_CARE_PROVIDER_SITE_OTHER): Payer: Managed Care, Other (non HMO) | Admitting: Nurse Practitioner

## 2022-12-31 VITALS — BP 114/68 | HR 60 | Temp 98.3°F | Resp 18 | Wt 223.8 lb

## 2022-12-31 DIAGNOSIS — S161XXA Strain of muscle, fascia and tendon at neck level, initial encounter: Secondary | ICD-10-CM | POA: Diagnosis not present

## 2022-12-31 DIAGNOSIS — K589 Irritable bowel syndrome without diarrhea: Secondary | ICD-10-CM | POA: Insufficient documentation

## 2022-12-31 DIAGNOSIS — E785 Hyperlipidemia, unspecified: Secondary | ICD-10-CM

## 2022-12-31 DIAGNOSIS — M791 Myalgia, unspecified site: Secondary | ICD-10-CM | POA: Diagnosis not present

## 2022-12-31 LAB — LIPID PANEL
Cholesterol: 149 mg/dL (ref 0–200)
HDL: 46.3 mg/dL (ref >39.00)
LDL Cholesterol: 75 mg/dL (ref 0–99)
NonHDL: 103.05
Total CHOL/HDL Ratio: 3
Triglycerides: 140 mg/dL (ref 0.0–149.0)
VLDL: 28 mg/dL (ref 0.0–40.0)

## 2022-12-31 LAB — CK: Total CK: 56 U/L (ref 7–232)

## 2022-12-31 MED ORDER — CYCLOBENZAPRINE HCL 5 MG PO TABS
5.0000 mg | ORAL_TABLET | Freq: Every evening | ORAL | 0 refills | Status: DC | PRN
Start: 2022-12-31 — End: 2023-12-03

## 2022-12-31 NOTE — Patient Instructions (Signed)
Use tylenol 500mg  every 8hrs as needed for pain Use muscle relaxant at bedtime Start neck and upper back exercise Maintain proper posture at all times Call office if no improvement in 2weeks Go to lab  Shoulder Exercises Ask your health care provider which exercises are safe for you. Do exercises exactly as told by your health care provider and adjust them as directed. It is normal to feel mild stretching, pulling, tightness, or discomfort as you do these exercises. Stop right away if you feel sudden pain or your pain gets worse. Do not begin these exercises until told by your health care provider. Stretching exercises External rotation and abduction This exercise is sometimes called corner stretch. The exercise rotates your arm outward (external rotation) and moves your arm out from your body (abduction). Stand in a doorway with one of your feet slightly in front of the other. This is called a staggered stance. If you cannot reach your forearms to the door frame, stand facing a corner of a room. Choose one of the following positions as told by your health care provider: Place your hands and forearms on the door frame above your head. Place your hands and forearms on the door frame at the height of your head. Place your hands on the door frame at the height of your elbows. Slowly move your weight onto your front foot until you feel a stretch across your chest and in the front of your shoulders. Keep your head and chest upright and keep your abdominal muscles tight. Hold for __________ seconds. To release the stretch, shift your weight to your back foot. Repeat __________ times. Complete this exercise __________ times a day. Extension, standing  Stand and hold a broomstick, a cane, or a similar object behind your back. Your hands should be a little wider than shoulder-width apart. Your palms should face away from your back. Keeping your elbows straight and your shoulder muscles relaxed, move  the stick away from your body until you feel a stretch in your shoulders (extension). Avoid shrugging your shoulders while you move the stick. Keep your shoulder blades tucked down toward the middle of your back. Hold for __________ seconds. Slowly return to the starting position. Repeat __________ times. Complete this exercise __________ times a day. Range-of-motion exercises Pendulum  Stand near a wall or a surface that you can hold onto for balance. Bend at the waist and let your left / right arm hang straight down. Use your other arm to support you. Keep your back straight and do not lock your knees. Relax your left / right arm and shoulder muscles, and move your hips and your trunk so your left / right arm swings freely. Your arm should swing because of the motion of your body, not because you are using your arm or shoulder muscles. Keep moving your hips and trunk so your arm swings in the following directions, as told by your health care provider: Side to side. Forward and backward. In clockwise and counterclockwise circles. Continue each motion for __________ seconds, or for as long as told by your health care provider. Slowly return to the starting position. Repeat __________ times. Complete this exercise __________ times a day. Shoulder flexion, standing  Stand and hold a broomstick, a cane, or a similar object. Place your hands a little more than shoulder-width apart on the object. Your left / right hand should be palm-up, and your other hand should be palm-down. Keep your elbow straight and your shoulder muscles relaxed. Push  the stick up with your healthy arm to raise your left / right arm in front of your body, and then over your head until you feel a stretch in your shoulder (flexion). Avoid shrugging your shoulder while you raise your arm. Keep your shoulder blade tucked down toward the middle of your back. Hold for __________ seconds. Slowly return to the starting  position. Repeat __________ times. Complete this exercise __________ times a day. Shoulder abduction, standing  Stand and hold a broomstick, a cane, or a similar object. Place your hands a little more than shoulder-width apart on the object. Your left / right hand should be palm-up, and your other hand should be palm-down. Keep your elbow straight and your shoulder muscles relaxed. Push the object across your body toward your left / right side. Raise your left / right arm to the side of your body (abduction) until you feel a stretch in your shoulder. Do not raise your arm above shoulder height unless your health care provider tells you to do that. If directed, raise your arm over your head. Avoid shrugging your shoulder while you raise your arm. Keep your shoulder blade tucked down toward the middle of your back. Hold for __________ seconds. Slowly return to the starting position. Repeat __________ times. Complete this exercise __________ times a day. Internal rotation  Place your left / right hand behind your back, palm-up. Use your other hand to dangle an exercise band, a broomstick, or a similar object over your shoulder. Grasp the band with your left / right hand so you are holding on to both ends. Gently pull up on the band until you feel a stretch in the front of your left / right shoulder. The movement of your arm toward the center of your body is called internal rotation. Avoid shrugging your shoulder while you raise your arm. Keep your shoulder blade tucked down toward the middle of your back. Hold for __________ seconds. Release the stretch by letting go of the band and lowering your hands. Repeat __________ times. Complete this exercise __________ times a day. Strengthening exercises External rotation  Sit in a stable chair without armrests. Secure an exercise band to a stable object at elbow height on your left / right side. Place a soft object, such as a folded towel or a small  pillow, between your left / right upper arm and your body to move your elbow about 4 inches (10 cm) away from your side. Hold the end of the exercise band so it is tight and there is no slack. Keeping your elbow pressed against the soft object, slowly move your forearm out, away from your abdomen (external rotation). Keep your body steady so only your forearm moves. Hold for __________ seconds. Slowly return to the starting position. Repeat __________ times. Complete this exercise __________ times a day. Shoulder abduction  Sit in a stable chair without armrests, or stand up. Hold a __________ lb / kg weight in your left / right hand, or hold an exercise band with both hands. Start with your arms straight down and your left / right palm facing in, toward your body. Slowly lift your left / right hand out to your side (abduction). Do not lift your hand above shoulder height unless your health care provider tells you that this is safe. Keep your arms straight. Avoid shrugging your shoulder while you do this movement. Keep your shoulder blade tucked down toward the middle of your back. Hold for __________ seconds. Slowly lower  your arm, and return to the starting position. Repeat __________ times. Complete this exercise __________ times a day. Shoulder extension  Sit in a stable chair without armrests, or stand up. Secure an exercise band to a stable object in front of you so it is at shoulder height. Hold one end of the exercise band in each hand. Straighten your elbows and lift your hands up to shoulder height. Squeeze your shoulder blades together as you pull your hands down to the sides of your thighs (extension). Stop when your hands are straight down by your sides. Do not let your hands go behind your body. Hold for __________ seconds. Slowly return to the starting position. Repeat __________ times. Complete this exercise __________ times a day. Shoulder row  Sit in a stable chair  without armrests, or stand up. Secure an exercise band to a stable object in front of you so it is at chest height. Hold one end of the exercise band in each hand. Position your palms so that your thumbs are facing the ceiling (neutral position). Bend each of your elbows to a 90-degree angle (right angle) and keep your upper arms at your sides. Step back or move the chair back until the band is tight and there is no slack. Slowly pull your elbows back behind you. Hold for __________ seconds. Slowly return to the starting position. Repeat __________ times. Complete this exercise __________ times a day. Shoulder press-ups  Sit in a stable chair that has armrests. Sit upright, with your feet flat on the floor. Put your hands on the armrests so your elbows are bent and your fingers are pointing forward. Your hands should be about even with the sides of your body. Push down on the armrests and use your arms to lift yourself off the chair. Straighten your elbows and lift yourself up as much as you comfortably can. Move your shoulder blades down, and avoid letting your shoulders move up toward your ears. Keep your feet on the ground. As you get stronger, your feet should support less of your body weight as you lift yourself up. Hold for __________ seconds. Slowly lower yourself back into the chair. Repeat __________ times. Complete this exercise __________ times a day. Wall push-ups  Stand so you are facing a stable wall. Your feet should be about one arm-length away from the wall. Lean forward and place your palms on the wall at shoulder height. Keep your feet flat on the floor as you bend your elbows and lean forward toward the wall. Hold for __________ seconds. Straighten your elbows to push yourself back to the starting position. Repeat __________ times. Complete this exercise __________ times a day. This information is not intended to replace advice given to you by your health care provider.  Make sure you discuss any questions you have with your health care provider. Document Revised: 04/15/2021 Document Reviewed: 04/15/2021 Elsevier Patient Education  2024 [employer]  Neck Exercises Ask your health care provider which exercises are safe for you. Do exercises exactly as told by your health care provider and adjust them as directed. It is normal to feel mild stretching, pulling, tightness, or discomfort as you do these exercises. Stop right away if you feel sudden pain or your pain gets worse. Do not begin these exercises until told by your health care provider. Neck exercises can be important for many reasons. They can improve strength and maintain flexibility in your neck, which will help your upper back and prevent neck pain.  Stretching exercises Rotation neck stretching  Sit in a chair or stand up. Place your feet flat on the floor, shoulder-width apart. Slowly turn your head (rotate) to the right until a slight stretch is felt. Turn it all the way to the right so you can look over your right shoulder. Do not tilt or tip your head. Hold this position for 10-30 seconds. Slowly turn your head (rotate) to the left until a slight stretch is felt. Turn it all the way to the left so you can look over your left shoulder. Do not tilt or tip your head. Hold this position for 10-30 seconds. Repeat __________ times. Complete this exercise __________ times a day. Neck retraction  Sit in a sturdy chair or stand up. Look straight ahead. Do not bend your neck. Use your fingers to push your chin backward (retraction). Do not bend your neck for this movement. Continue to face straight ahead. If you are doing the exercise properly, you will feel a slight sensation in your throat and a stretch at the back of your neck. Hold the stretch for 1-2 seconds. Repeat __________ times. Complete this exercise __________ times a day. Strengthening exercises Neck press  Lie on your back on a firm bed  or on the floor with a pillow under your head. Use your neck muscles to push your head down on the pillow and straighten your spine. Hold the position as well as you can. Keep your head facing up (in a neutral position) and your chin tucked. Slowly count to 5 while holding this position. Repeat __________ times. Complete this exercise __________ times a day. Isometrics These are exercises in which you strengthen the muscles in your neck while keeping your neck still (isometrics). Sit in a supportive chair and place your hand on your forehead. Keep your head and face facing straight ahead. Do not flex or extend your neck while doing isometrics. Push forward with your head and neck while pushing back with your hand. Hold for 10 seconds. Do the sequence again, this time putting your hand against the back of your head. Use your head and neck to push backward against the hand pressure. Finally, do the same exercise on either side of your head, pushing sideways against the pressure of your hand. Repeat __________ times. Complete this exercise __________ times a day. Prone head lifts  Lie face-down (prone position), resting on your elbows so that your chest and upper back are raised. Start with your head facing downward, near your chest. Position your chin either on or near your chest. Slowly lift your head upward. Lift until you are looking straight ahead. Then continue lifting your head as far back as you can comfortably stretch. Hold your head up for 5 seconds. Then slowly lower it to your starting position. Repeat __________ times. Complete this exercise __________ times a day. Supine head lifts  Lie on your back (supine position), bending your knees to point to the ceiling and keeping your feet flat on the floor. Lift your head slowly off the floor, raising your chin toward your chest. Hold for 5 seconds. Repeat __________ times. Complete this exercise __________ times a day. Scapular  retraction  Stand with your arms at your sides. Look straight ahead. Slowly pull both shoulders (scapulae) backward and downward (retraction) until you feel a stretch between your shoulder blades in your upper back. Hold for 10-30 seconds. Relax and repeat. Repeat __________ times. Complete this exercise __________ times a day. Contact a health care  provider if: Your neck pain or discomfort gets worse when you do an exercise. Your neck pain or discomfort does not improve within 2 hours after you exercise. If you have any of these problems, stop exercising right away. Do not do the exercises again unless your health care provider says that you can. Get help right away if: You develop sudden, severe neck pain. If this happens, stop exercising right away. Do not do the exercises again unless your health care provider says that you can. This information is not intended to replace advice given to you by your health care provider. Make sure you discuss any questions you have with your health care provider. Document Revised: 08/20/2020 Document Reviewed: 08/20/2020 Elsevier Patient Education  2024 [employer].

## 2022-12-31 NOTE — Assessment & Plan Note (Addendum)
Colonoscopy completed 12/2022: normal Levsin and pantoprazole recommended

## 2022-12-31 NOTE — Progress Notes (Signed)
Established Patient Visit  Patient: Shane Munoz   DOB: 07-13-1980   42 y.o. Adult  MRN: [id number] Visit Date: 12/31/2022  Subjective:    Chief Complaint  Patient presents with   Acute Visit    PT is here for muscle pain with left ear ache, pain in neck and jaw after taking Tricor 45 mg . PT have been alternating between  tylenol and ibuprofen for pain.    Neck Pain  This is a new problem. The current episode started 1 to 4 weeks ago. The problem occurs intermittently. The problem has been waxing and waning. The pain is associated with nothing. The pain is present in the left side. The quality of the pain is described as aching and cramping. The symptoms are aggravated by twisting. Stiffness is present In the morning. Pertinent negatives include no chest pain, fever, headaches, leg pain, numbness, pain with swallowing, paresis, photophobia, syncope, tingling, trouble swallowing, visual change or weakness. Grissom has tried acetaminophen and NSAIDs for the symptoms.  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  Reports intermittent muscle pain. He suspect this is related to use of fenofibrate. He wants CK level tested. Denies any fatigue or joint swelling or weakness.  Wt Readings from Last 3 Encounters:  12/31/22 223 lb 12.8 oz (101.5 kg)  12/24/22 226 lb (102.5 kg)  12/18/22 229 lb (103.9 kg)   Intentional weight loss through diet modification and daily exercise.  Reviewed medical, surgical, and social history today  Medications: Outpatient Medications Prior to Visit  Medication Sig   albuterol (VENTOLIN HFA) 108 (90 Base) MCG/ACT inhaler Inhale 1 puff into the lungs as needed.   amLODipine (NORVASC) 5 MG tablet Take 1 tablet (5 mg total) by mouth at bedtime.   EPINEPHrine 0.3 mg/0.3 mL IJ SOAJ injection See admin instructions.   fenofibrate (TRICOR) 145 MG tablet TAKE ONE TABLET BY MOUTH ONE TIME DAILY   hyoscyamine (LEVSIN SL) 0.125 MG SL tablet Place 1 tablet (0.125  mg total) under the tongue every 8 (eight) hours as needed.   levocetirizine (XYZAL) 5 MG tablet Take 5 mg by mouth every evening.   pantoprazole (PROTONIX) 20 MG tablet Take 1 tablet (20 mg total) by mouth 2 (two) times daily.   valACYclovir (VALTREX) 1000 MG tablet TAKE 2 TABLETS(2000 MG) BY MOUTH TWICE DAILY FOR 1 DAY   Vitamin D, Ergocalciferol, (DRISDOL) 1.25 MG (50000 UNIT) CAPS capsule Take 1 capsule (50,000 Units total) by mouth every 7 (seven) days.   No facility-administered medications prior to visit.   Reviewed past medical and social history.   ROS per HPI above      Objective:  BP 114/68 (BP Location: Right Arm, Patient Position: Sitting, Cuff Size: Large)   Pulse 60   Temp 98.3 F (36.8 C) (Temporal)   Resp 18   Wt 223 lb 12.8 oz (101.5 kg)   SpO2 100%   BMI 33.05 kg/m      Physical Exam Vitals and nursing note reviewed.  Cardiovascular:     Rate and Rhythm: Normal rate.     Pulses: Normal pulses.  Pulmonary:     Effort: Pulmonary effort is normal.  Musculoskeletal:        General: Tenderness present. No swelling.     Right shoulder: Normal.     Left shoulder: Tenderness present. No swelling, deformity, effusion, laceration, bony tenderness or crepitus. Normal range of motion.  Normal strength. Normal pulse.     Right upper arm: Normal.     Left upper arm: Normal.     Right elbow: Normal.     Left elbow: Normal.     Right hand: Normal.     Left hand: Normal.     Cervical back: Normal range of motion and neck supple. Tenderness present. No rigidity.  Lymphadenopathy:     Cervical: No cervical adenopathy.  Skin:    General: Skin is warm and dry.     Findings: No erythema or rash.  Neurological:     Mental Status: Saw is alert and oriented to person, place, and time.     No results found for any visits on 12/31/22.    Assessment & Plan:    Problem List Items Addressed This Visit     Dyslipidemia   Relevant Orders   Lipid panel   Other  Visit Diagnoses     Cervical muscle strain, initial encounter    -  Primary   Relevant Medications   cyclobenzaprine (FLEXERIL) 5 MG tablet   Myalgia       Relevant Orders   CK     I suspect upper back and neck pain is due to poor posture. Low suspicion about medication side effect. Maintain fenofibrate dose at this time Advised to minimize use of NASIDS due to GERD symptoms. Use tylenol, flexeril, neck and should home exercises. Provided printed information. Consider referral for outpatient PT if no improvement with home exercise.  Return if symptoms worsen or fail to improve.     Alysia Penna, NP

## 2023-01-05 ENCOUNTER — Ambulatory Visit (HOSPITAL_COMMUNITY): Payer: Managed Care, Other (non HMO) | Attending: Internal Medicine

## 2023-01-05 ENCOUNTER — Ambulatory Visit: Payer: Managed Care, Other (non HMO) | Admitting: Physical Therapy

## 2023-01-05 DIAGNOSIS — I10 Essential (primary) hypertension: Secondary | ICD-10-CM | POA: Insufficient documentation

## 2023-01-05 DIAGNOSIS — R001 Bradycardia, unspecified: Secondary | ICD-10-CM | POA: Insufficient documentation

## 2023-01-05 DIAGNOSIS — E785 Hyperlipidemia, unspecified: Secondary | ICD-10-CM | POA: Insufficient documentation

## 2023-01-05 LAB — ECHOCARDIOGRAM COMPLETE
Area-P 1/2: 2.39 cm2
S' Lateral: 3.4 cm

## 2023-01-11 ENCOUNTER — Encounter: Payer: Self-pay | Admitting: Nurse Practitioner

## 2023-01-11 DIAGNOSIS — F411 Generalized anxiety disorder: Secondary | ICD-10-CM

## 2023-01-12 MED ORDER — VENLAFAXINE HCL ER 37.5 MG PO CP24
37.5000 mg | ORAL_CAPSULE | Freq: Every day | ORAL | 5 refills | Status: DC
Start: 2023-01-12 — End: 2023-06-01

## 2023-01-25 ENCOUNTER — Other Ambulatory Visit: Payer: Self-pay | Admitting: Nurse Practitioner

## 2023-01-25 DIAGNOSIS — E781 Pure hyperglyceridemia: Secondary | ICD-10-CM

## 2023-03-01 ENCOUNTER — Other Ambulatory Visit: Payer: Self-pay | Admitting: Nurse Practitioner

## 2023-03-01 DIAGNOSIS — I10 Essential (primary) hypertension: Secondary | ICD-10-CM

## 2023-05-19 ENCOUNTER — Other Ambulatory Visit: Payer: Self-pay | Admitting: Nurse Practitioner

## 2023-05-19 DIAGNOSIS — K219 Gastro-esophageal reflux disease without esophagitis: Secondary | ICD-10-CM

## 2023-06-01 ENCOUNTER — Ambulatory Visit (INDEPENDENT_AMBULATORY_CARE_PROVIDER_SITE_OTHER): Payer: Managed Care, Other (non HMO) | Admitting: Nurse Practitioner

## 2023-06-01 ENCOUNTER — Encounter: Payer: Self-pay | Admitting: Nurse Practitioner

## 2023-06-01 VITALS — BP 122/70 | HR 60 | Temp 98.3°F | Ht 70.0 in | Wt 249.0 lb

## 2023-06-01 DIAGNOSIS — E781 Pure hyperglyceridemia: Secondary | ICD-10-CM

## 2023-06-01 DIAGNOSIS — E785 Hyperlipidemia, unspecified: Secondary | ICD-10-CM

## 2023-06-01 DIAGNOSIS — I10 Essential (primary) hypertension: Secondary | ICD-10-CM

## 2023-06-01 DIAGNOSIS — R739 Hyperglycemia, unspecified: Secondary | ICD-10-CM

## 2023-06-01 DIAGNOSIS — F411 Generalized anxiety disorder: Secondary | ICD-10-CM | POA: Diagnosis not present

## 2023-06-01 LAB — POCT GLYCOSYLATED HEMOGLOBIN (HGB A1C): Hemoglobin A1C: 5.5 % (ref 4.0–5.6)

## 2023-06-01 MED ORDER — AMLODIPINE BESYLATE 5 MG PO TABS
5.0000 mg | ORAL_TABLET | Freq: Every day | ORAL | 3 refills | Status: AC
Start: 2023-06-01 — End: ?

## 2023-06-01 MED ORDER — VENLAFAXINE HCL ER 75 MG PO CP24
75.0000 mg | ORAL_CAPSULE | Freq: Every day | ORAL | 3 refills | Status: AC
Start: 2023-06-01 — End: ?

## 2023-06-01 MED ORDER — FENOFIBRATE 145 MG PO TABS: 145.0000 mg | ORAL_TABLET | Freq: Every day | ORAL | 1 refills | Status: DC

## 2023-06-01 NOTE — Patient Instructions (Signed)
 Mediterranean Diet A Mediterranean diet is based on the traditions of countries on the [employer]. It focuses on eating more: Fruits and vegetables. Whole grains, beans, nuts, and seeds. Heart-healthy fats. These are fats that are good for your heart. It involves eating less: Dairy. Meat and eggs. Processed foods with added sugar, salt, and fat. This type of diet can help prevent certain conditions. It can also improve outcomes if you have a long-term (chronic) disease, such as kidney or heart disease. What are tips for following this plan? Reading food labels Check packaged foods for: The serving size. For foods such as rice and pasta, the serving size is the amount of cooked product, not dry. The total fat. Avoid foods with saturated fat or trans fat. Added sugars, such as corn syrup. Shopping  Try to have a balanced diet. Buy a variety of foods, such as: Fresh fruits and vegetables. You may be able to get these from local farmers markets. You can also buy them frozen. Grains, beans, nuts, and seeds. Some of these can be bought in bulk. Fresh seafood. Poultry and eggs. Low-fat dairy products. Buy whole ingredients instead of foods that have already been packaged. If you can't get fresh seafood, buy precooked frozen shrimp or canned fish, such as tuna, salmon, or sardines. Stock your pantry so you always have certain foods on hand, such as olive oil, canned tuna, canned tomatoes, rice, pasta, and beans. Cooking Cook foods with extra-virgin olive oil instead of using butter or other vegetable oils. Have meat as a side dish. Have vegetables or grains as your main dish. This means having meat in small portions or adding small amounts of meat to foods like pasta or stew. Use beans or vegetables instead of meat in common dishes like chili or lasagna. Try out different cooking methods. Try roasting, broiling, steaming, and sauting vegetables. Add frozen vegetables to soups, stews,  pasta, or rice. Add nuts or seeds for added healthy fats and plant protein at each meal. You can add these to yogurt, salads, or vegetable dishes. Marinate fish or vegetables using olive oil, lemon juice, garlic, and fresh herbs. Meal planning Plan to eat a vegetarian meal one day each week. Try to work up to two vegetarian meals, if possible. Eat seafood two or more times a week. Have healthy snacks on hand. These may include: Vegetable sticks with hummus. Greek yogurt. Fruit and nut trail mix. Eat balanced meals. These should include: Fruit: 2-3 servings a day. Vegetables: 4-5 servings a day. Low-fat dairy: 2 servings a day. Fish, poultry, or lean meat: 1 serving a day. Beans and legumes: 2 or more servings a week. Nuts and seeds: 1-2 servings a day. Whole grains: 6-8 servings a day. Extra-virgin olive oil: 3-4 servings a day. Limit red meat and sweets to just a few servings a month. Lifestyle  Try to cook and eat meals with your family. Drink enough fluid to keep your pee (urine) pale yellow. Be active every day. This includes: Aerobic exercise, which is exercise that causes your heart to beat faster. Examples include running and swimming. Leisure activities like gardening, walking, or housework. Get 7-8 hours of sleep each night. Drink red wine if your provider says you can. A glass of wine is 5 oz (150 mL). You may be allowed to have: Up to 1 glass a day if you're male and not pregnant. Up to 2 glasses a day if you're male. What foods should I eat? Fruits Apples. Apricots. Avocado.  Berries. Bananas. Cherries. Dates. Figs. Grapes. Lemons. Melon. Oranges. Peaches. Plums. Pomegranate. Vegetables Artichokes. Beets. Broccoli. Cabbage. Carrots. Eggplant. Green beans. Chard. Kale. Spinach. Onions. Leeks. Peas. Squash. Tomatoes. Peppers. Radishes. Grains Whole-grain pasta. Brown rice. Bulgur wheat. Polenta. Couscous. Whole-wheat bread. Orpah Cobb. Meats and other  proteins Beans. Almonds. Sunflower seeds. Pine nuts. Peanuts. Cod. Salmon. Scallops. Shrimp. Tuna. Tilapia. Clams. Oysters. Eggs. Chicken or [place] without skin. Dairy Low-fat milk. Cheese. Greek yogurt. Fats and oils Extra-virgin olive oil. Avocado oil. Grapeseed oil. Beverages Water. Red wine. Herbal tea. Sweets and desserts Greek yogurt with honey. Baked apples. Poached pears. Trail mix. Seasonings and condiments Basil. Cilantro. Coriander. Cumin. Mint. Parsley. Sage. Rosemary. Tarragon. Garlic. Oregano. Thyme. Pepper. Balsamic vinegar. Tahini. Hummus. Tomato sauce. Olives. Mushrooms. The items listed above may not be all the foods and drinks you can have. Talk to a dietitian to learn more. What foods should I limit? This is a list of foods that should be eaten rarely. Fruits Fruit canned in syrup. Vegetables Deep-fried potatoes, like [place] fries. Grains Packaged pasta or rice dishes. Cereal with added sugar. Snacks with added sugar. Meats and other proteins Beef. Pork. Lamb. Chicken or [place] with skin. Hot dogs. Tomasa Blase. Dairy Ice cream. Sour cream. Whole milk. Fats and oils Butter. Canola oil. Vegetable oil. Beef fat (tallow). Lard. Beverages Juice. Sugar-sweetened soft drinks. Beer. Liquor and spirits. Sweets and desserts Cookies. Cakes. Pies. Candy. Seasonings and condiments Mayonnaise. Pre-made sauces and marinades. The items listed above may not be all the foods and drinks you should limit. Talk to a dietitian to learn more. Where to find more information American Heart Association (AHA): heart.org This information is not intended to replace advice given to you by your health care provider. Make sure you discuss any questions you have with your health care provider. Document Revised: 06/07/2022 Document Reviewed: 06/07/2022 Elsevier Patient Education  2024 [employer].

## 2023-06-01 NOTE — Assessment & Plan Note (Signed)
 Improved lipid panel with fenofibrate and diet modification No ETOh consumption x 6months

## 2023-06-01 NOTE — Assessment & Plan Note (Signed)
Repeat hgbA1c: 5.5% improved

## 2023-06-01 NOTE — Progress Notes (Signed)
 Established Patient Visit  Patient: Shane Munoz   DOB: [birth year]   43 y.o. Adult  MRN: [id number] Visit Date: 06/01/2023  Subjective:    Chief Complaint  Patient presents with   Follow-up    6 month f/u   HPI GAD (generalized anxiety disorder) Increased dose to 75mg  x 64month ago, improved mood. Counselling session every 3months and use of AI feedback guided meditation. Maintain med dose refill sent  HTN (hypertension), benign Bp at goal with amlodipine BP Readings from Last 3 Encounters:  06/01/23 122/70  12/31/22 114/68  12/24/22 (!) 107/59    Maintain med dose  Dyslipidemia Improved lipid panel with fenofibrate and diet modification No ETOh consumption x 6months  Hyperglycemia Repeat hgbA1c: 5.5% improved   Reviewed medical, surgical, and social history today  Medications: Outpatient Medications Prior to Visit  Medication Sig   albuterol (VENTOLIN HFA) 108 (90 Base) MCG/ACT inhaler Inhale 1 puff into the lungs as needed.   cyclobenzaprine (FLEXERIL) 5 MG tablet Take 1 tablet (5 mg total) by mouth at bedtime as needed for muscle spasms.   EPINEPHrine 0.3 mg/0.3 mL IJ SOAJ injection See admin instructions.   hyoscyamine (LEVSIN SL) 0.125 MG SL tablet Place 1 tablet (0.125 mg total) under the tongue every 8 (eight) hours as needed.   levocetirizine (XYZAL) 5 MG tablet Take 5 mg by mouth every evening.   pantoprazole (PROTONIX) 20 MG tablet TAKE ONE TABLET BY MOUTH TWICE A DAY   valACYclovir (VALTREX) 1000 MG tablet TAKE 2 TABLETS(2000 MG) BY MOUTH TWICE DAILY FOR 1 DAY   [DISCONTINUED] amLODipine (NORVASC) 5 MG tablet TAKE ONE TABLET BY MOUTH AT BEDTIME   [DISCONTINUED] fenofibrate (TRICOR) 145 MG tablet TAKE ONE TABLET BY MOUTH ONE TIME DAILY   [DISCONTINUED] venlafaxine XR (EFFEXOR XR) 37.5 MG 24 hr capsule Take 1 capsule (37.5 mg total) by mouth daily with breakfast.   [DISCONTINUED] Vitamin D, Ergocalciferol, (DRISDOL) 1.25 MG (50000 UNIT) CAPS  capsule Take 1 capsule (50,000 Units total) by mouth every 7 (seven) days. (Patient not taking: Reported on 06/01/2023)   No facility-administered medications prior to visit.   Reviewed past medical and social history.   ROS per HPI above      Objective:  BP 122/70 (BP Location: Left Arm, Patient Position: Sitting, Cuff Size: Normal)   Pulse 60   Temp 98.3 F (36.8 C) (Temporal)   Ht 5\' 10"  (1.778 m)   Wt 249 lb (112.9 kg)   SpO2 97%   BMI 35.73 kg/m      Physical Exam Vitals and nursing note reviewed.  Cardiovascular:     Rate and Rhythm: Normal rate and regular rhythm.     Pulses: Normal pulses.     Heart sounds: Normal heart sounds.  Pulmonary:     Effort: Pulmonary effort is normal.     Breath sounds: Normal breath sounds.  Musculoskeletal:     Right lower leg: No edema.     Left lower leg: No edema.  Neurological:     Mental Status: Shane Munoz is alert and oriented to person, place, and time.     Results for orders placed or performed in visit on 06/01/23  POCT glycosylated hemoglobin (Hb A1C)  Result Value Ref Range   Hemoglobin A1C 5.5 4.0 - 5.6 %   HbA1c POC (<> result, manual entry)     HbA1c, POC (prediabetic range)     HbA1c, POC (  controlled diabetic range)        Assessment & Plan:    Problem List Items Addressed This Visit     Dyslipidemia   Improved lipid panel with fenofibrate and diet modification No ETOh consumption x 6months      Relevant Medications   fenofibrate (TRICOR) 145 MG tablet   GAD (generalized anxiety disorder)   Increased dose to 75mg  x 27month ago, improved mood. Counselling session every 3months and use of AI feedback guided meditation. Maintain med dose refill sent      Relevant Medications   venlafaxine XR (EFFEXOR XR) 75 MG 24 hr capsule   HTN (hypertension), benign - Primary   Bp at goal with amlodipine BP Readings from Last 3 Encounters:  06/01/23 122/70  12/31/22 114/68  12/24/22 (!) 107/59    Maintain med  dose      Relevant Medications   amLODipine (NORVASC) 5 MG tablet   fenofibrate (TRICOR) 145 MG tablet   Hyperglycemia   Repeat hgbA1c: 5.5% improved      Relevant Orders   POCT glycosylated hemoglobin (Hb A1C) (Completed)   Other Visit Diagnoses       Hypertriglyceridemia       Relevant Medications   amLODipine (NORVASC) 5 MG tablet   fenofibrate (TRICOR) 145 MG tablet      Return in about 6 months (around 12/02/2023) for CPE (fasting).     Alysia Penna, NP

## 2023-06-01 NOTE — Assessment & Plan Note (Signed)
 Bp at goal with amlodipine BP Readings from Last 3 Encounters:  06/01/23 122/70  12/31/22 114/68  12/24/22 (!) 107/59    Maintain med dose

## 2023-06-01 NOTE — Assessment & Plan Note (Addendum)
 Increased dose to 75mg  x 67month ago, improved mood. Counselling session every 3months and use of AI feedback guided meditation. Maintain med dose refill sent

## 2023-08-11 ENCOUNTER — Other Ambulatory Visit: Payer: Self-pay | Admitting: Nurse Practitioner

## 2023-08-11 DIAGNOSIS — B001 Herpesviral vesicular dermatitis: Secondary | ICD-10-CM

## 2023-10-13 ENCOUNTER — Other Ambulatory Visit: Payer: Self-pay | Admitting: Nurse Practitioner

## 2023-10-13 DIAGNOSIS — B001 Herpesviral vesicular dermatitis: Secondary | ICD-10-CM

## 2023-11-21 ENCOUNTER — Other Ambulatory Visit: Payer: Self-pay | Admitting: Nurse Practitioner

## 2023-11-21 DIAGNOSIS — K219 Gastro-esophageal reflux disease without esophagitis: Secondary | ICD-10-CM

## 2023-12-03 ENCOUNTER — Other Ambulatory Visit (HOSPITAL_BASED_OUTPATIENT_CLINIC_OR_DEPARTMENT_OTHER): Payer: Self-pay

## 2023-12-03 ENCOUNTER — Ambulatory Visit: Admitting: Nurse Practitioner

## 2023-12-03 ENCOUNTER — Ambulatory Visit: Payer: Self-pay | Admitting: Nurse Practitioner

## 2023-12-03 VITALS — BP 128/80 | HR 59 | Temp 97.6°F | Ht 71.0 in | Wt 255.0 lb

## 2023-12-03 DIAGNOSIS — Z0001 Encounter for general adult medical examination with abnormal findings: Secondary | ICD-10-CM | POA: Diagnosis not present

## 2023-12-03 DIAGNOSIS — Z23 Encounter for immunization: Secondary | ICD-10-CM | POA: Diagnosis not present

## 2023-12-03 DIAGNOSIS — E785 Hyperlipidemia, unspecified: Secondary | ICD-10-CM

## 2023-12-03 DIAGNOSIS — R739 Hyperglycemia, unspecified: Secondary | ICD-10-CM

## 2023-12-03 DIAGNOSIS — H6991 Unspecified Eustachian tube disorder, right ear: Secondary | ICD-10-CM

## 2023-12-03 DIAGNOSIS — I10 Essential (primary) hypertension: Secondary | ICD-10-CM

## 2023-12-03 LAB — COMPREHENSIVE METABOLIC PANEL WITH GFR
ALT: 19 U/L (ref 0–53)
AST: 16 U/L (ref 0–37)
Albumin: 4.6 g/dL (ref 3.5–5.2)
Alkaline Phosphatase: 64 U/L (ref 39–117)
BUN: 17 mg/dL (ref 6–23)
CO2: 27 meq/L (ref 19–32)
Calcium: 9.5 mg/dL (ref 8.4–10.5)
Chloride: 103 meq/L (ref 96–112)
Creatinine, Ser: 1.03 mg/dL (ref 0.40–1.50)
GFR: 87.94 mL/min (ref >60.00)
Glucose, Bld: 106 mg/dL — ABNORMAL HIGH (ref 70–99)
Potassium: 4 meq/L (ref 3.5–5.1)
Sodium: 138 meq/L (ref 135–145)
Total Bilirubin: 0.4 mg/dL (ref 0.2–1.2)
Total Protein: 7.1 g/dL (ref 6.0–8.3)

## 2023-12-03 LAB — LIPID PANEL
Cholesterol: 163 mg/dL (ref 0–200)
HDL: 45.4 mg/dL (ref >39.00)
LDL Cholesterol: 98 mg/dL (ref 0–99)
NonHDL: 117.43
Total CHOL/HDL Ratio: 4
Triglycerides: 97 mg/dL (ref 0.0–149.0)
VLDL: 19.4 mg/dL (ref 0.0–40.0)

## 2023-12-03 LAB — HEMOGLOBIN A1C: Hgb A1c MFr Bld: 6.1 % (ref 4.6–6.5)

## 2023-12-03 MED ORDER — COMIRNATY 30 MCG/0.3ML IM SUSY
0.3000 mL | PREFILLED_SYRINGE | Freq: Once | INTRAMUSCULAR | 0 refills | Status: AC
  Filled 2023-12-03: qty 0.3, 1d supply, fill #0

## 2023-12-03 NOTE — Patient Instructions (Signed)
 Go to lab Maintain Heart healthy diet and daily exercise. Maintain current medications.

## 2023-12-03 NOTE — Assessment & Plan Note (Signed)
 Bp at goal with amlodipine  BP Readings from Last 3 Encounters:  12/03/23 128/80  06/01/23 122/70  12/31/22 114/68    Maintain med dose

## 2023-12-03 NOTE — Assessment & Plan Note (Signed)
 Repeat lipid panel ?

## 2023-12-03 NOTE — Progress Notes (Signed)
 Complete physical exam  Patient: Shane Munoz   DOB: 04/21/1980   43 y.o. Adult  MRN: [id number] Visit Date: 12/03/2023  Subjective:    Chief Complaint  Patient presents with   Annual Exam    FASTING  Wants Flu vaccine   Shane Munoz is a 43 y.o. adult who presents today for a complete physical exam. Shane Munoz reports consuming a general diet. No consistent regimen Shane Munoz generally feels well. Shane Munoz reports sleeping well. Shane Munoz does not have additional problems to discuss today.  Vision:Yes Dental:Yes STD Screen:No  BP Readings from Last 3 Encounters:  12/03/23 128/80  06/01/23 122/70  12/31/22 114/68   Wt Readings from Last 3 Encounters:  12/03/23 255 lb (115.7 kg)  06/01/23 249 lb (112.9 kg)  12/31/22 223 lb 12.8 oz (101.5 kg)    Most recent fall risk assessment:    12/03/2023    9:10 AM  Fall Risk   Falls in the past year? 1  Number falls in past yr: 0  Injury with Fall? 0  Risk for fall due to : History of fall(s)  Follow up Falls evaluation completed    Depression screen:Yes - Depression Most recent depression screenings:    12/03/2023    9:11 AM 06/01/2023    9:27 AM  PHQ 2/9 Scores  PHQ - 2 Score 1 0  PHQ- 9 Score 5 4   Ear Fullness  There is pain in the right ear. This is a chronic problem. The current episode started more than 1 year ago. The problem occurs constantly. The problem has been waxing and waning. There has been no fever. The patient is experiencing no pain. Associated symptoms include hearing loss. Pertinent negatives include no abdominal pain, coughing, diarrhea, ear discharge, headaches, neck pain, rash, rhinorrhea, sore throat or vomiting. Shane Munoz has tried nothing for the symptoms. Shane Munoz's past medical history is significant for a chronic ear infection and hearing loss. There is no history of a tympanostomy tube.    No problem-specific Assessment & Plan notes found for this encounter.  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   GERD (gastroesophageal reflux disease)    Hyperlipidemia    Laryngopharyngeal reflux (LPR) 12/20/2019   PTSD (post-traumatic stress disorder)    Substance abuse (HCC)    9 yrs ago/ all substances   Past Surgical History:  Procedure Laterality Date   MIDDLE EAR SURGERY     tube inside   Social History   Socioeconomic History   Marital status: Married    Spouse name: Not on file   Number of children: Not on file   Years of education: Not on file   Highest education level: Bachelor's degree (e.g., BA, AB, BS)  Occupational History   Not on file  Tobacco Use   Smoking status: Never   Smokeless tobacco: Never  Vaping Use   Vaping status: Never Used  Substance and Sexual Activity   Alcohol use: Not Currently    Alcohol/week: 2.0 standard drinks of alcohol    Comment: social   Drug use: Not Currently    Types: Marijuana    Comment: clean for 9 years   Sexual activity: Not Currently    Birth control/protection: Abstinence, None  Other Topics Concern   Not on file  Social History Narrative   Not on file   Social  Drivers of Health   Financial Resource Strain: Medium Risk (12/03/2023)   Overall Financial Resource Strain (CARDIA)    Difficulty of Paying Living Expenses: Somewhat hard  Food Insecurity: No Food Insecurity (12/03/2023)   Hunger Vital Sign    Worried About Running Out of Food in the Last Year: Never true    Ran Out of Food in the Last Year: Never true  Transportation Needs: No Transportation Needs (12/03/2023)   PRAPARE - Administrator, Civil Service (Medical): No    Lack of Transportation (Non-Medical): No  Physical Activity: Insufficiently Active (12/03/2023)   Exercise Vital Sign    Days of Exercise per Week: 3 days    Minutes of Exercise per Session: 30 min  Stress: No Stress Concern Present  (12/03/2023)   [employer] of Occupational Health - Occupational Stress Questionnaire    Feeling of Stress: Only a little  Social Connections: Socially Integrated (12/03/2023)   Social Connection and Isolation Panel    Frequency of Communication with Friends and Family: Three times a week    Frequency of Social Gatherings with Friends and Family: Twice a week    Attends Religious Services: More than 4 times per year    Active Member of Clubs or Organizations: Yes    Attends Engineer, structural: More than 4 times per year    Marital Status: Married  Catering manager Violence: Not on file   Family Status  Relation Name Status   Mother Shane Munoz Alive   Father Shane Munoz Deceased   MGM Shane Munoz Deceased   MGF  Deceased   Neg Hx  (Not Specified)  No partnership data on file   Family History  Problem Relation Age of Onset   Obesity Mother    Heart disease Father 61   Diabetes Father    Obesity Father    Stroke Maternal Grandmother 46   Heart disease Maternal Grandfather 54   Colon cancer Neg Hx    Esophageal cancer Neg Hx    Liver cancer Neg Hx    Pancreatic cancer Neg Hx    Rectal cancer Neg Hx    Allergies  Allergen Reactions   Metoprolol  Palpitations    Patient Care Team: Urijah Arko, Roselie Rockford, NP as PCP - General (Internal Medicine) Loni Soyla LABOR, MD as PCP - Cardiology (Cardiology)   Medications: Outpatient Medications Prior to Visit  Medication Sig   hyoscyamine  (LEVSIN  SL) 0.125 MG SL tablet Place 1 tablet (0.125 mg total) under the tongue every 8 (eight) hours as needed.   levocetirizine (XYZAL) 5 MG tablet Take 5 mg by mouth every evening.   pantoprazole  (PROTONIX ) 20 MG tablet TAKE ONE TABLET BY MOUTH TWICE A DAY   valACYclovir  (VALTREX ) 1000 MG tablet TAKE TWO TABLETS BY MOUTH TWICE A DAY FOR 1 DAY   venlafaxine  XR (EFFEXOR  XR) 75 MG 24 hr capsule Take 1 capsule (75 mg total) by mouth daily with breakfast.   albuterol (VENTOLIN  HFA) 108 (90 Base) MCG/ACT inhaler Inhale 1 puff into the lungs as needed.   amLODipine  (NORVASC ) 5 MG tablet Take 1 tablet (5 mg total) by mouth at bedtime.   EPINEPHrine 0.3 mg/0.3 mL IJ SOAJ injection See admin instructions.   fenofibrate  (TRICOR ) 145 MG tablet Take 1 tablet (145 mg total) by mouth daily.   [DISCONTINUED] cyclobenzaprine  (FLEXERIL ) 5 MG tablet Take 1 tablet (5 mg total) by mouth at bedtime as needed for muscle spasms. (Patient not taking: Reported on 12/03/2023)  No facility-administered medications prior to visit.    Review of Systems  Constitutional:  Negative for activity change, appetite change and unexpected weight change.  HENT:  Positive for hearing loss. Negative for ear discharge, rhinorrhea and sore throat.   Respiratory: Negative.  Negative for cough.   Cardiovascular: Negative.   Gastrointestinal: Negative.  Negative for abdominal pain, diarrhea and vomiting.  Endocrine: Negative for cold intolerance and heat intolerance.  Genitourinary: Negative.   Musculoskeletal: Negative.  Negative for neck pain.  Skin: Negative.  Negative for rash.  Neurological: Negative.  Negative for headaches.  Hematological: Negative.   Psychiatric/Behavioral:  Negative for behavioral problems, decreased concentration, dysphoric mood, hallucinations, self-injury, sleep disturbance and suicidal ideas. The patient is not nervous/anxious.         Objective:  BP 128/80 (BP Location: Left Arm, Patient Position: Sitting, Cuff Size: Large)   Pulse (!) 59   Temp 97.6 F (36.4 C) (Oral)   Ht 5' 11 (1.803 m)   Wt 255 lb (115.7 kg)   SpO2 96%   BMI 35.57 kg/m     Physical Exam Vitals and nursing note reviewed.  Constitutional:      General: [place] is not in acute distress. HENT:     Right Ear: Tympanic membrane, ear canal and external ear normal.     Left Ear: Tympanic membrane, ear canal and external ear normal.     Nose: Nose normal.  Eyes:     Extraocular Movements:  Extraocular movements intact.     Conjunctiva/sclera: Conjunctivae normal.     Pupils: Pupils are equal, round, and reactive to light.  Neck:     Thyroid : No thyroid  mass, thyromegaly or thyroid  tenderness.  Cardiovascular:     Rate and Rhythm: Normal rate and regular rhythm.     Pulses: Normal pulses.     Heart sounds: Normal heart sounds.  Pulmonary:     Effort: Pulmonary effort is normal.     Breath sounds: Normal breath sounds.  Abdominal:     General: Bowel sounds are normal.     Palpations: Abdomen is soft.  Musculoskeletal:        General: Normal range of motion.     Cervical back: Normal range of motion and neck supple.     Right lower leg: No edema.     Left lower leg: No edema.  Lymphadenopathy:     Cervical: No cervical adenopathy.  Skin:    General: Skin is warm and dry.  Neurological:     Mental Status: [place] is alert and oriented to person, place, and time.     Cranial Nerves: No cranial nerve deficit.  Psychiatric:        Mood and Affect: Mood normal.        Behavior: Behavior normal.        Thought Content: Thought content normal.     No results found for any visits on 12/03/23.    Assessment & Plan:    Routine Health Maintenance and Physical Exam  Immunization History  Administered Date(s) Administered   INFLUENZA, HIGH DOSE SEASONAL PF 01/02/2020, 05/16/2021   Influenza Inj Mdck Quad Pf 12/24/2016   Influenza Split 12/24/2016, 12/07/2017, 11/22/2018, 12/05/2019   Influenza, Seasonal, Injecte, Preservative Fre 12/02/2022, 12/03/2023   Influenza,inj,Quad PF,6+ Mos 12/07/2017, 11/22/2018, 12/05/2019, 11/13/2020, 12/15/2021   Influenza-Unspecified 12/24/2016, 12/07/2017, 11/22/2018, 12/05/2019, 11/13/2020, 12/08/2021   PFIZER(Purple Top)SARS-COV-2 Vaccination 05/29/2019, 06/26/2019, 12/24/2019, 01/02/2020, 05/16/2021   Pfizer Covid-19 Vaccine Bivalent Booster 77yrs & up 11/25/2020, 11/08/2021  Pfizer(Comirnaty )Fall Seasonal Vaccine 12 years and  older 01/29/2023   Tdap 07/12/2017, 03/17/2023   Health Maintenance  Topic Date Due   Hepatitis C Screening  Never done   Hepatitis B Vaccines 19-59 Average Risk (1 of 3 - 19+ 3-dose series) Never done   HPV VACCINES (1 - 3-dose SCDM series) Never done   Cervical Cancer Screening (HPV/Pap Cotest)  Never done   Mammogram  Never done   Colonoscopy  12/23/2032   DTaP/Tdap/Td (3 - Td or Tdap) 03/16/2033   Influenza Vaccine  Completed   COVID-19 Vaccine  Completed   HIV Screening  Completed   Pneumococcal Vaccine  Aged Out   Meningococcal B Vaccine  Aged Out   Discussed health benefits of physical activity, and encouraged Fransisco to engage in regular exercise appropriate for Dina's age and condition.  Problem List Items Addressed This Visit     Dyslipidemia   Relevant Orders   Lipid panel   Gastro-esophageal reflux disease without esophagitis   HTN (hypertension), benign   Hyperglycemia   Relevant Orders   Hemoglobin A1c   Other Visit Diagnoses       Encounter for preventative adult health care exam with abnormal findings    -  Primary   Relevant Orders   Comprehensive metabolic panel with GFR     Eustachian tube disorder, right       Relevant Orders   Ambulatory referral to ENT     Immunization due       Relevant Orders   Flu vaccine trivalent PF, 6mos and older(Flulaval,Afluria,Fluarix,Fluzone) (Completed)      Return in about 6 months (around 06/01/2024) for HTN, depression and anxiety, hyperlipidemia (fasting).     Roselie Mood, NP

## 2024-01-10 ENCOUNTER — Ambulatory Visit (INDEPENDENT_AMBULATORY_CARE_PROVIDER_SITE_OTHER)

## 2024-01-10 ENCOUNTER — Encounter (INDEPENDENT_AMBULATORY_CARE_PROVIDER_SITE_OTHER): Payer: Self-pay

## 2024-01-10 VITALS — BP 112/72 | HR 70 | Temp 97.6°F | Ht 70.0 in | Wt 250.0 lb

## 2024-01-10 DIAGNOSIS — H6991 Unspecified Eustachian tube disorder, right ear: Secondary | ICD-10-CM

## 2024-01-10 DIAGNOSIS — H9071 Mixed conductive and sensorineural hearing loss, unilateral, right ear, with unrestricted hearing on the contralateral side: Secondary | ICD-10-CM

## 2024-01-10 NOTE — Progress Notes (Signed)
 Dear Dr. Katheen, Here is my assessment for our mutual patient, Shane Munoz. Thank you for allowing me the opportunity to care for your patient. Please do not hesitate to contact me should you have any other questions. Sincerely, Dr. Penne Croak  Otolaryngology Clinic Note Referring provider: Dr. Katheen HPI:  Discussed the use of AI scribe software for clinical note transcription with the patient, who gave verbal consent to proceed.  History of Present Illness Shane Munoz is a 43 year old with a history of childhood ear surgery who presents with concerns about Eustachian tube disorder and changes in hearing. They were referred by [facility] for evaluation of Eustachian tube disorder.  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 following ear surgery with placement of a non-dissolving tube - Accustomed to reduced hearing on the right side since childhood - Recently, lying on the affected ear temporarily restores hearing for approximately 30 seconds or until movement occurs - Notable difference in hearing when pressure is applied to the right ear - No recent hearing test performed; did not follow up on previously ordered test due to lack of contact from provider  Transient tinnitus - Experienced transient tinnitus in the past year, primarily when lying down at night - Tinnitus has since resolved  Independent Review of Additional Tests or Records:  Reviewed external note from referring PCP, Nche,describing relevant history incorporated into today's evaluation.   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   GERD (gastroesophageal reflux disease)    Hyperlipidemia    Laryngopharyngeal reflux (LPR) 12/20/2019   PTSD (post-traumatic stress disorder)    Substance abuse (HCC)    9 yrs ago/ all  substances     Past Surgical History:  Procedure Laterality Date   MIDDLE EAR SURGERY     tube inside    Family History  Problem Relation Age of Onset   Obesity Mother    Heart disease Father 30   Diabetes Father    Obesity Father    Stroke Maternal Grandmother 77   Heart disease Maternal Grandfather 37   Colon cancer Neg Hx    Esophageal cancer Neg Hx    Liver cancer Neg Hx    Pancreatic cancer Neg Hx    Rectal cancer Neg Hx      Social Connections: Socially Integrated (12/03/2023)   Social Connection and Isolation Panel    Frequency of Communication with Friends and Family: Three times a week    Frequency of Social Gatherings with Friends and Family: Twice a week    Attends Religious Services: More than 4 times per year    Active Member of Clubs or Organizations: Yes    Attends Engineer, Structural: More than 4 times per year    Marital Status: Married      Current Outpatient Medications:    albuterol (VENTOLIN HFA) 108 (90 Base) MCG/ACT inhaler, Inhale 1 puff into the lungs as needed., Disp: , Rfl:    amLODipine  (NORVASC ) 5 MG tablet, Take 1 tablet (5 mg total) by mouth at bedtime., Disp: 90 tablet, Rfl: 3   EPINEPHrine 0.3 mg/0.3 mL IJ SOAJ injection, See admin instructions., Disp: , Rfl:    fenofibrate  (TRICOR ) 145 MG tablet, Take 1 tablet (145 mg total) by mouth daily., Disp: 90 tablet, Rfl: 1   hyoscyamine  (  LEVSIN  SL) 0.125 MG SL tablet, Place 1 tablet (0.125 mg total) under the tongue every 8 (eight) hours as needed., Disp: 45 tablet, Rfl: 1   levocetirizine (XYZAL) 5 MG tablet, Take 5 mg by mouth every evening., Disp: , Rfl:    pantoprazole  (PROTONIX ) 20 MG tablet, TAKE ONE TABLET BY MOUTH TWICE A DAY, Disp: 60 tablet, Rfl: 5   valACYclovir  (VALTREX ) 1000 MG tablet, TAKE TWO TABLETS BY MOUTH TWICE A DAY FOR 1 DAY, Disp: 4 tablet, Rfl: 1   venlafaxine  XR (EFFEXOR  XR) 75 MG 24 hr capsule, Take 1 capsule (75 mg total) by mouth daily with breakfast., Disp: 90  capsule, Rfl: 3   Physical Exam:   BP 112/72   Pulse 70   Temp 97.6 F (36.4 C)   Ht 5' 10 (1.778 m)   Wt 250 lb (113.4 kg)   SpO2 96%   BMI 35.87 kg/m   The patient was awake, alert, and appropriate. The external ears were inspected, and otoscopy was performed to evaluate the external auditory canals and tympanic membranes. The nasal cavity and septum were examined for mucosal changes, obstruction, or discharge. The oral cavity and oropharynx were inspected for mucosal lesions, infection, or tonsillar hypertrophy. The neck was palpated for lymphadenopathy, thyroid  abnormalities, or other masses. Cranial nerve function was grossly intact.  Pertinent Findings: Physical Exam HEENT: Right eardrum with scar, otherwise normal. Left eardrum normal. Normal oropharynx, tonsils present. NECK: Neck supple, no masses. Post auricular scar on the right   Seprately Identifiable Procedures:  I personally ordered, reviewed and interpreted the following with the patient today  Procedure: Bilateral ear microscopy using microscope (CPT [id number]) Pre-procedure diagnosis: right sided hearing loss Post-procedure diagnosis: same Indication: see above; given patient's otologic complaints and history, for improved and comprehensive examination of external ear and tympanic membrane, bilateral otologic examination using microscope was performed. Prior to proceeding, verbal consent was obtained after discussion of R/B/A  Procedure: Patient was placed semi-recumbent. Both ear canals were examined using the microscope with findings below. Patient tolerated the procedure well.  Right ear:  No significant lesions pinna. EAC: no significant lesions. Canal is clear. Eczematoid changes. minimal TM: Intact, mild tympanosclerosis  Left ear:  No significant lesions pinna. EAC: no significant lesions. Canal is clear. Eczematoid changes. minimal TM: Intact    Impression & Plans:  Shane Munoz is a 43 y.o. adult   1. Mixed conductive and sensorineural hearing loss of right ear, unspecified hearing status on contralateral side   2. ETD (Eustachian tube dysfunction), right    - Findings and diagnoses discussed in detail with the patient. - Risks, benefits, and alternatives were reviewed. Through shared decision making, the patient elects to proceed with below. Assessment & Plan Right mixed conductive and sensorineural hearing loss Chronic right ear hearing loss likely due to previous surgery. Possible Eustachian tube dysfunction or anatomical changes. Differential includes conductive loss from anatomical changes or sensorineural loss from nerve involvement. Normal eardrum with scarring, no acute abnormalities. - Ordered hearing test to assess current hearing status. - Ordered CT scan to evaluate ossicular chain and bony structures. - Consider MRI if hearing test indicates asymmetric sensorineural loss.  - Orders placed:  Orders Placed This Encounter  Procedures   CT TEMPORAL BONES WO CONTRAST   Ambulatory referral to Audiology   - Medications prescribed/continued/adjusted: No orders of the defined types were placed in this encounter.  - Education materials provided to the patient. - Follow up: 6 weeks. Patient instructed to  return sooner or go to the ED if new/worsening symptoms develop.   Thank you for allowing me the opportunity to care for your patient. Please do not hesitate to contact me should you have any other questions.  Sincerely, Penne Croak, DO Otolaryngologist (ENT) [facility] Phone: [phone] Fax: [phone]  01/10/2024, 2:40 PM

## 2024-02-18 ENCOUNTER — Ambulatory Visit (INDEPENDENT_AMBULATORY_CARE_PROVIDER_SITE_OTHER): Admitting: Audiology

## 2024-02-18 ENCOUNTER — Ambulatory Visit (INDEPENDENT_AMBULATORY_CARE_PROVIDER_SITE_OTHER)

## 2024-03-04 ENCOUNTER — Other Ambulatory Visit: Payer: Self-pay | Admitting: Nurse Practitioner

## 2024-03-04 DIAGNOSIS — E781 Pure hyperglyceridemia: Secondary | ICD-10-CM

## 2024-03-08 NOTE — Telephone Encounter (Signed)
 Requesting: FENOFIBRATE  145 MG TAB  Last Visit: 12/03/2023 Next Visit: 06/01/2024 Last Refill: 06/01/2023  Please Advise

## 2024-03-23 ENCOUNTER — Ambulatory Visit (HOSPITAL_BASED_OUTPATIENT_CLINIC_OR_DEPARTMENT_OTHER)

## 2024-03-25 ENCOUNTER — Ambulatory Visit (HOSPITAL_BASED_OUTPATIENT_CLINIC_OR_DEPARTMENT_OTHER): Admission: RE | Admit: 2024-03-25 | Discharge: 2024-03-25 | Disposition: A | Source: Ambulatory Visit

## 2024-03-25 DIAGNOSIS — H9071 Mixed conductive and sensorineural hearing loss, unilateral, right ear, with unrestricted hearing on the contralateral side: Secondary | ICD-10-CM | POA: Insufficient documentation

## 2024-04-21 ENCOUNTER — Ambulatory Visit (INDEPENDENT_AMBULATORY_CARE_PROVIDER_SITE_OTHER)

## 2024-04-21 ENCOUNTER — Ambulatory Visit (INDEPENDENT_AMBULATORY_CARE_PROVIDER_SITE_OTHER): Admitting: Audiology

## 2024-06-01 ENCOUNTER — Ambulatory Visit: Admitting: Nurse Practitioner
